# Patient Record
Sex: Female | Born: 1940 | Hispanic: Yes | Marital: Single | Smoking: Never smoker
Health system: Southern US, Community
[De-identification: ages and names within clinical notes are randomized; demographics above are authoritative.]

## PROBLEM LIST (undated history)

## (undated) DIAGNOSIS — E119 Type 2 diabetes mellitus without complications: Secondary | ICD-10-CM

## (undated) DIAGNOSIS — I10 Essential (primary) hypertension: Secondary | ICD-10-CM

## (undated) DIAGNOSIS — I2510 Atherosclerotic heart disease of native coronary artery without angina pectoris: Secondary | ICD-10-CM

---

## 2021-03-08 ENCOUNTER — Emergency Department (HOSPITAL_COMMUNITY): Payer: Self-pay

## 2021-03-08 ENCOUNTER — Inpatient Hospital Stay (HOSPITAL_COMMUNITY): Admission: EM | Disposition: E | Payer: Self-pay | Source: Home / Self Care | Attending: Cardiology

## 2021-03-08 ENCOUNTER — Encounter (HOSPITAL_COMMUNITY): Payer: Self-pay | Admitting: Emergency Medicine

## 2021-03-08 ENCOUNTER — Inpatient Hospital Stay (HOSPITAL_COMMUNITY)
Admission: EM | Admit: 2021-03-08 | Discharge: 2021-03-17 | DRG: 270 | Disposition: E | Payer: Self-pay | Attending: Cardiology | Admitting: Cardiology

## 2021-03-08 ENCOUNTER — Other Ambulatory Visit: Payer: Self-pay

## 2021-03-08 ENCOUNTER — Encounter (HOSPITAL_COMMUNITY): Payer: Self-pay

## 2021-03-08 ENCOUNTER — Emergency Department (HOSPITAL_COMMUNITY)
Admission: EM | Admit: 2021-03-08 | Discharge: 2021-03-08 | Disposition: A | Discharge status: Home / Self Care | Payer: Self-pay | Attending: Emergency Medicine | Admitting: Emergency Medicine

## 2021-03-08 DIAGNOSIS — I2109 ST elevation (STEMI) myocardial infarction involving other coronary artery of anterior wall: Principal | ICD-10-CM | POA: Diagnosis present

## 2021-03-08 DIAGNOSIS — J9601 Acute respiratory failure with hypoxia: Secondary | ICD-10-CM | POA: Diagnosis present

## 2021-03-08 DIAGNOSIS — K529 Noninfective gastroenteritis and colitis, unspecified: Secondary | ICD-10-CM | POA: Diagnosis present

## 2021-03-08 DIAGNOSIS — I701 Atherosclerosis of renal artery: Secondary | ICD-10-CM | POA: Diagnosis present

## 2021-03-08 DIAGNOSIS — I700 Atherosclerosis of aorta: Secondary | ICD-10-CM | POA: Diagnosis present

## 2021-03-08 DIAGNOSIS — R570 Cardiogenic shock: Secondary | ICD-10-CM | POA: Diagnosis present

## 2021-03-08 DIAGNOSIS — I10 Essential (primary) hypertension: Secondary | ICD-10-CM | POA: Insufficient documentation

## 2021-03-08 DIAGNOSIS — Z20822 Contact with and (suspected) exposure to covid-19: Secondary | ICD-10-CM | POA: Diagnosis present

## 2021-03-08 DIAGNOSIS — D1809 Hemangioma of other sites: Secondary | ICD-10-CM | POA: Diagnosis present

## 2021-03-08 DIAGNOSIS — Z66 Do not resuscitate: Secondary | ICD-10-CM | POA: Diagnosis not present

## 2021-03-08 DIAGNOSIS — N170 Acute kidney failure with tubular necrosis: Secondary | ICD-10-CM | POA: Diagnosis present

## 2021-03-08 DIAGNOSIS — I472 Ventricular tachycardia: Secondary | ICD-10-CM | POA: Diagnosis present

## 2021-03-08 DIAGNOSIS — R1084 Generalized abdominal pain: Secondary | ICD-10-CM

## 2021-03-08 DIAGNOSIS — I213 ST elevation (STEMI) myocardial infarction of unspecified site: Secondary | ICD-10-CM | POA: Diagnosis present

## 2021-03-08 DIAGNOSIS — Z79899 Other long term (current) drug therapy: Secondary | ICD-10-CM | POA: Insufficient documentation

## 2021-03-08 DIAGNOSIS — I2510 Atherosclerotic heart disease of native coronary artery without angina pectoris: Secondary | ICD-10-CM

## 2021-03-08 DIAGNOSIS — Z515 Encounter for palliative care: Secondary | ICD-10-CM

## 2021-03-08 DIAGNOSIS — I462 Cardiac arrest due to underlying cardiac condition: Secondary | ICD-10-CM | POA: Diagnosis present

## 2021-03-08 DIAGNOSIS — I4901 Ventricular fibrillation: Secondary | ICD-10-CM | POA: Diagnosis present

## 2021-03-08 DIAGNOSIS — Z8544 Personal history of malignant neoplasm of other female genital organs: Secondary | ICD-10-CM | POA: Insufficient documentation

## 2021-03-08 DIAGNOSIS — J9600 Acute respiratory failure, unspecified whether with hypoxia or hypercapnia: Secondary | ICD-10-CM

## 2021-03-08 DIAGNOSIS — Z9889 Other specified postprocedural states: Secondary | ICD-10-CM

## 2021-03-08 DIAGNOSIS — E119 Type 2 diabetes mellitus without complications: Secondary | ICD-10-CM | POA: Diagnosis present

## 2021-03-08 DIAGNOSIS — J9602 Acute respiratory failure with hypercapnia: Secondary | ICD-10-CM | POA: Diagnosis present

## 2021-03-08 HISTORY — PX: LEFT HEART CATH AND CORONARY ANGIOGRAPHY: CATH118249

## 2021-03-08 HISTORY — DX: Atherosclerotic heart disease of native coronary artery without angina pectoris: I25.10

## 2021-03-08 HISTORY — PX: CORONARY/GRAFT ACUTE MI REVASCULARIZATION: CATH118305

## 2021-03-08 HISTORY — DX: Essential (primary) hypertension: I10

## 2021-03-08 HISTORY — DX: Type 2 diabetes mellitus without complications: E11.9

## 2021-03-08 HISTORY — PX: IABP INSERTION: CATH118242

## 2021-03-08 HISTORY — PX: RIGHT HEART CATH: CATH118263

## 2021-03-08 LAB — URINALYSIS, ROUTINE W REFLEX MICROSCOPIC
Bacteria, UA: NONE SEEN
Bilirubin Urine: NEGATIVE
Glucose, UA: NEGATIVE mg/dL
Ketones, ur: NEGATIVE mg/dL
Nitrite: NEGATIVE
Protein, ur: 30 mg/dL — AB
Specific Gravity, Urine: 1.01 (ref 1.005–1.030)
pH: 7 (ref 5.0–8.0)

## 2021-03-08 LAB — COMPREHENSIVE METABOLIC PANEL
ALT: 41 U/L (ref 0–44)
AST: 92 U/L — ABNORMAL HIGH (ref 15–41)
Albumin: 4.4 g/dL (ref 3.5–5.0)
Alkaline Phosphatase: 67 U/L (ref 38–126)
Anion gap: 9 (ref 5–15)
BUN: 15 mg/dL (ref 8–23)
CO2: 25 mmol/L (ref 22–32)
Calcium: 10 mg/dL (ref 8.9–10.3)
Chloride: 101 mmol/L (ref 98–111)
Creatinine, Ser: 1.61 mg/dL — ABNORMAL HIGH (ref 0.44–1.00)
GFR, Estimated: 33 mL/min — ABNORMAL LOW (ref >60)
Glucose, Bld: 145 mg/dL — ABNORMAL HIGH (ref 70–99)
Potassium: 4.8 mmol/L (ref 3.5–5.1)
Sodium: 135 mmol/L (ref 135–145)
Total Bilirubin: 0.7 mg/dL (ref 0.3–1.2)
Total Protein: 7.3 g/dL (ref 6.5–8.1)

## 2021-03-08 LAB — CBC WITH DIFFERENTIAL/PLATELET
Abs Immature Granulocytes: 0.08 10*3/uL — ABNORMAL HIGH (ref 0.00–0.07)
Basophils Absolute: 0 10*3/uL (ref 0.0–0.1)
Basophils Relative: 0 %
Eosinophils Absolute: 0 10*3/uL (ref 0.0–0.5)
Eosinophils Relative: 0 %
HCT: 39.6 % (ref 36.0–46.0)
Hemoglobin: 12.9 g/dL (ref 12.0–15.0)
Immature Granulocytes: 1 %
Lymphocytes Relative: 7 %
Lymphs Abs: 0.8 10*3/uL (ref 0.7–4.0)
MCH: 29.9 pg (ref 26.0–34.0)
MCHC: 32.6 g/dL (ref 30.0–36.0)
MCV: 91.7 fL (ref 80.0–100.0)
Monocytes Absolute: 1 10*3/uL (ref 0.1–1.0)
Monocytes Relative: 8 %
Neutro Abs: 9.8 10*3/uL — ABNORMAL HIGH (ref 1.7–7.7)
Neutrophils Relative %: 84 %
Platelets: 214 10*3/uL (ref 150–400)
RBC: 4.32 MIL/uL (ref 3.87–5.11)
RDW: 15.4 % (ref 11.5–15.5)
WBC: 11.7 10*3/uL — ABNORMAL HIGH (ref 4.0–10.5)
nRBC: 0 % (ref 0.0–0.2)

## 2021-03-08 LAB — CBC
HCT: 41.4 % (ref 36.0–46.0)
Hemoglobin: 13.9 g/dL (ref 12.0–15.0)
MCH: 29.5 pg (ref 26.0–34.0)
MCHC: 33.6 g/dL (ref 30.0–36.0)
MCV: 87.9 fL (ref 80.0–100.0)
Platelets: 207 10*3/uL (ref 150–400)
RBC: 4.71 MIL/uL (ref 3.87–5.11)
RDW: 14.9 % (ref 11.5–15.5)
WBC: 5.9 10*3/uL (ref 4.0–10.5)
nRBC: 0 % (ref 0.0–0.2)

## 2021-03-08 LAB — I-STAT CHEM 8, ED
BUN: 23 mg/dL (ref 8–23)
Calcium, Ion: 1.15 mmol/L (ref 1.15–1.40)
Chloride: 99 mmol/L (ref 98–111)
Creatinine, Ser: 2 mg/dL — ABNORMAL HIGH (ref 0.44–1.00)
Glucose, Bld: 301 mg/dL — ABNORMAL HIGH (ref 70–99)
HCT: 42 % (ref 36.0–46.0)
Hemoglobin: 14.3 g/dL (ref 12.0–15.0)
Potassium: 4.2 mmol/L (ref 3.5–5.1)
Sodium: 133 mmol/L — ABNORMAL LOW (ref 135–145)
TCO2: 17 mmol/L — ABNORMAL LOW (ref 22–32)

## 2021-03-08 LAB — RESP PANEL BY RT-PCR (FLU A&B, COVID) ARPGX2
Influenza A by PCR: NEGATIVE
Influenza B by PCR: NEGATIVE
SARS Coronavirus 2 by RT PCR: NEGATIVE

## 2021-03-08 LAB — HEMOGLOBIN A1C
Hgb A1c MFr Bld: 5.9 % — ABNORMAL HIGH (ref 4.8–5.6)
Mean Plasma Glucose: 122.63 mg/dL

## 2021-03-08 LAB — TROPONIN I (HIGH SENSITIVITY): Troponin I (High Sensitivity): 117 ng/L (ref <18)

## 2021-03-08 LAB — LIPASE, BLOOD: Lipase: 32 U/L (ref 11–51)

## 2021-03-08 SURGERY — CORONARY/GRAFT ACUTE MI REVASCULARIZATION
Anesthesia: LOCAL

## 2021-03-08 MED ORDER — SODIUM BICARBONATE 8.4 % IV SOLN
INTRAVENOUS | Status: DC | PRN
  Administered 2021-03-08 (×3): 50 meq via INTRAVENOUS

## 2021-03-08 MED ORDER — SODIUM BICARBONATE 8.4 % IV SOLN
INTRAVENOUS | Status: AC
  Filled 2021-03-08: qty 100

## 2021-03-08 MED ORDER — FENTANYL CITRATE (PF) 100 MCG/2ML IJ SOLN
50.0000 ug | Freq: Once | INTRAMUSCULAR | Status: AC
  Administered 2021-03-08: 50 ug via INTRAVENOUS
  Filled 2021-03-08: qty 2

## 2021-03-08 MED ORDER — METRONIDAZOLE 500 MG PO TABS: 500.0000 mg | ORAL_TABLET | Freq: Two times a day (BID) | ORAL | 0 refills | Status: AC

## 2021-03-08 MED ORDER — HEPARIN SODIUM (PORCINE) 5000 UNIT/ML IJ SOLN: 4000.0000 [IU] | Freq: Once | INTRAMUSCULAR | Status: DC

## 2021-03-08 MED ORDER — SODIUM CHLORIDE 0.9 % IV BOLUS
2000.0000 mL | Freq: Once | INTRAVENOUS | Status: AC
  Administered 2021-03-08: 2000 mL via INTRAVENOUS

## 2021-03-08 MED ORDER — CIPROFLOXACIN HCL 500 MG PO TABS: 500.0000 mg | ORAL_TABLET | Freq: Two times a day (BID) | ORAL | 0 refills | Status: AC

## 2021-03-08 MED ORDER — SUCCINYLCHOLINE CHLORIDE 20 MG/ML IJ SOLN
100.0000 mg | Freq: Once | INTRAMUSCULAR | Status: AC
  Administered 2021-03-08: 100 mg via INTRAVENOUS

## 2021-03-08 MED ORDER — METRONIDAZOLE 500 MG PO TABS
500.0000 mg | ORAL_TABLET | Freq: Once | ORAL | Status: AC
  Administered 2021-03-08: 500 mg via ORAL
  Filled 2021-03-08: qty 1

## 2021-03-08 MED ORDER — EPINEPHRINE 1 MG/10ML IJ SOSY
PREFILLED_SYRINGE | INTRAMUSCULAR | Status: DC | PRN
  Administered 2021-03-08: 0.5 mg via INTRAVENOUS

## 2021-03-08 MED ORDER — ASPIRIN 81 MG PO CHEW
324.0000 mg | CHEWABLE_TABLET | Freq: Once | ORAL | Status: AC
  Administered 2021-03-08: 324 mg via ORAL

## 2021-03-08 MED ORDER — PROPOFOL 1000 MG/100ML IV EMUL
INTRAVENOUS | Status: AC
  Filled 2021-03-08: qty 100

## 2021-03-08 MED ORDER — LIDOCAINE HCL (CARDIAC) PF 100 MG/5ML IV SOSY
PREFILLED_SYRINGE | INTRAVENOUS | Status: DC | PRN
  Administered 2021-03-08: 100 mg via INTRAVENOUS

## 2021-03-08 MED ORDER — AMIODARONE HCL 150 MG/3ML IV SOLN
INTRAVENOUS | Status: AC
  Filled 2021-03-08: qty 3

## 2021-03-08 MED ORDER — NOREPINEPHRINE 4 MG/250ML-% IV SOLN
INTRAVENOUS | Status: AC
  Administered 2021-03-08: 10 ug/kg/min
  Administered 2021-03-09: 55 ug/min
  Filled 2021-03-08: qty 250

## 2021-03-08 MED ORDER — LACTATED RINGERS IV BOLUS
500.0000 mL | Freq: Once | INTRAVENOUS | Status: AC
  Administered 2021-03-08: 500 mL via INTRAVENOUS

## 2021-03-08 MED ORDER — HEPARIN (PORCINE) IN NACL 1000-0.9 UT/500ML-% IV SOLN
INTRAVENOUS | Status: DC | PRN
  Administered 2021-03-08 – 2021-03-09 (×5): 500 mL

## 2021-03-08 MED ORDER — SODIUM BICARBONATE 8.4 % IV SOLN
INTRAVENOUS | Status: AC
  Filled 2021-03-08: qty 50

## 2021-03-08 MED ORDER — CIPROFLOXACIN HCL 500 MG PO TABS
500.0000 mg | ORAL_TABLET | Freq: Once | ORAL | Status: AC
  Administered 2021-03-08: 500 mg via ORAL
  Filled 2021-03-08: qty 1

## 2021-03-08 MED ORDER — ONDANSETRON HCL 4 MG/2ML IJ SOLN
4.0000 mg | Freq: Once | INTRAMUSCULAR | Status: AC
  Administered 2021-03-08: 4 mg via INTRAVENOUS

## 2021-03-08 MED ORDER — FENTANYL CITRATE (PF) 100 MCG/2ML IJ SOLN
INTRAMUSCULAR | Status: DC | PRN
  Administered 2021-03-08 (×2): 12.5 ug via INTRAVENOUS
  Administered 2021-03-08 – 2021-03-09 (×2): 25 ug via INTRAVENOUS

## 2021-03-08 MED ORDER — MIDAZOLAM HCL 2 MG/2ML IJ SOLN
INTRAMUSCULAR | Status: AC
  Filled 2021-03-08: qty 2

## 2021-03-08 MED ORDER — IOHEXOL 300 MG/ML  SOLN
75.0000 mL | Freq: Once | INTRAMUSCULAR | Status: AC | PRN
  Administered 2021-03-08: 75 mL via INTRAVENOUS

## 2021-03-08 MED ORDER — FENTANYL CITRATE (PF) 100 MCG/2ML IJ SOLN
INTRAMUSCULAR | Status: AC
  Filled 2021-03-08: qty 2

## 2021-03-08 MED ORDER — MIDAZOLAM HCL 2 MG/2ML IJ SOLN
INTRAMUSCULAR | Status: DC | PRN
  Administered 2021-03-08: 1 mg via INTRAVENOUS
  Administered 2021-03-08 (×2): 0.5 mg via INTRAVENOUS
  Administered 2021-03-09: 1 mg via INTRAVENOUS

## 2021-03-08 MED ORDER — ETOMIDATE 2 MG/ML IV SOLN
20.0000 mg | Freq: Once | INTRAVENOUS | Status: AC
  Administered 2021-03-08: 20 mg via INTRAVENOUS

## 2021-03-08 MED ORDER — DOPAMINE-DEXTROSE 3.2-5 MG/ML-% IV SOLN
5.0000 ug/kg/min | INTRAVENOUS | Status: DC
  Administered 2021-03-08: 5 ug/kg/min via INTRAVENOUS

## 2021-03-08 MED ORDER — EPINEPHRINE HCL 5 MG/250ML IV SOLN IN NS
0.5000 ug/min | INTRAVENOUS | Status: DC
  Administered 2021-03-08: 10 ug/min via INTRAVENOUS
  Administered 2021-03-09 (×2): 30 ug/min via INTRAVENOUS
  Filled 2021-03-08 (×2): qty 250

## 2021-03-08 MED ORDER — HEPARIN SODIUM (PORCINE) 5000 UNIT/ML IJ SOLN
4000.0000 [IU] | Freq: Once | INTRAMUSCULAR | Status: AC
  Administered 2021-03-08: 4000 [IU] via INTRAVENOUS

## 2021-03-08 SURGICAL SUPPLY — 21 items
BALLN IABP SENSA PLUS 7.5F 40C (BALLOONS) ×2
BALLOON IABP SENS PLUS 7.5F40C (BALLOONS) ×1 IMPLANT
CATH INFINITI 5FR MULTPACK ANG (CATHETERS) ×2 IMPLANT
CATH LAUNCHER 6FR EBU3.5 (CATHETERS) ×2 IMPLANT
CATH OPTICROSS HD (CATHETERS) ×2 IMPLANT
CATH SWAN GANZ VIP 7.5F (CATHETERS) ×2 IMPLANT
KIT ENCORE 26 ADVANTAGE (KITS) ×2 IMPLANT
KIT HEART LEFT (KITS) ×2 IMPLANT
KIT MICROPUNCTURE NIT STIFF (SHEATH) ×2 IMPLANT
PACK CARDIAC CATHETERIZATION (CUSTOM PROCEDURE TRAY) ×2 IMPLANT
SHEATH PINNACLE 6F 10CM (SHEATH) ×2 IMPLANT
SHEATH PINNACLE 8F 10CM (SHEATH) ×2 IMPLANT
SLED PULL BACK IVUS (MISCELLANEOUS) ×2 IMPLANT
SLEEVE REPOSITIONING LENGTH 30 (MISCELLANEOUS) ×2 IMPLANT
TRANSDUCER W/STOPCOCK (MISCELLANEOUS) ×2 IMPLANT
TUBING CIL FLEX 10 FLL-RA (TUBING) ×2 IMPLANT
WIRE ASAHI PROWATER 180CM (WIRE) ×2 IMPLANT
WIRE COUGAR XT STRL 190CM (WIRE) ×2 IMPLANT
WIRE EMERALD 3MM-J .025X260CM (WIRE) ×2 IMPLANT
WIRE EMERALD 3MM-J .035X150CM (WIRE) ×2 IMPLANT
WIRE HI TORQ WHISPER MS 190CM (WIRE) ×2 IMPLANT

## 2021-03-08 NOTE — ED Notes (Addendum)
Shocked( 200J) x1 for VFib. Pulse present .

## 2021-03-08 NOTE — ED Triage Notes (Signed)
Patient reports low back pain / generalized abdominal pain with brief syncopal episode at home this evening , hypotensive at triage , Code Stemi activated by EDP .

## 2021-03-08 NOTE — ED Notes (Signed)
CardiologistEDP explained procedure and plan of care to the patient and son .

## 2021-03-08 NOTE — ED Provider Notes (Signed)
Pt is here visiting her daughter.  She lives in [place] and plans to return in May.  She was signed out by Dr. Johnney Killian pending CT results:   IMPRESSION:  1. Segmental wall thickening and inflammatory changes of the  rectosigmoid colon, consistent with inflammatory or infectious  colitis.  2. 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  3. Diffuse bladder wall thickening, nonspecific given decompressed  state.  4. Indeterminate hypodensity inferior aspect of the spleen, which  could reflect focal scarring or small hemangioma.  5. Extensive calcification of the mitral annulus, with diffuse  coronary artery atherosclerosis.  6. Aortic Atherosclerosis (ICD10-I70.0).   Due to language barrier, an interpreter was present during the history-taking and subsequent discussion (and for part of the physical exam) with this patient.  The atherosclerosis changes are chronic.  Pt is followed by a cardiologist in [place].  I told her that she needs to f/u with cards when she gets back and likely needs vascular as well.  I printed out the CT scan so she can send it into her doctor.  She and her daughter understand.    Pt is stable for d/c.  Return if worse.   Isla Pence, MD 03/15/2021 [phone]

## 2021-03-08 NOTE — H&P (Addendum)
Modesto Charon La Laretha Luepke is an 80 y.o. female.   Chief Complaint: Chest pain HPI:   80 year old Spanish-speaking female visiting from [place], known baseline questionable coronary artery disease, hypertension, type 2 diabetes mellitus, now with STEMI and cardiogenic shock.  Patient initially presented to [facility] emergency room with abdominal pain earlier on 02/20/2021.  Work-up at that time was suggestive of colitis seen on CT abdomen and pelvis.  She also had a hemangioma seen in the spleen, along with extensive aortic atherosclerosis including high-grade renal artery stenosis.  Patient was discharged home.  However, she returned to the emergency room few hours later complaining of right-sided as well as left-sided chest pain.  At this time, she was found to have anterior STEMI.  Blood pressure was 60/30 mmHg, likely cardiogenic shock.  I had extensive discussion with the patient and her son, using an interpreter at bedside.  Given worsening mental status, patient was unable to comprehend the discussion.  She was subsequently intubated as endotracheal tube was displaced with the first attempt.  Patient was initially on norepinephrine drip.  Dopamine was added.  Patient immediately went into ventricular tachycardia, requiring 1 shock that restored sinus rhythm.  Epinephrine was then added.   Overall, patient is in cardiogenic shock and has very high risk of mortality, as well as complications such as bleeding, contrast-induced nephropathy, limb ischemia, arrhythmia, death.  Also discussed the case with Dr. Pierre Bali.  Given her extensive atherosclerosis, she has high risk of limb ischemia and may not be able to tolerate Impella or any higher mechanical support.  I may start with balloon pump support, followed by revascularization.    Past Medical History:  Diagnosis Date  . Coronary artery disease   . Diabetes mellitus without complication ([place])   . Hypertension     History reviewed.  No pertinent surgical history.  No family history on file.  Social History:  reports that she has never smoked. She has never used smokeless tobacco. She reports that she does not drink alcohol and does not use drugs.  Allergies: No Known Allergies  Review of Systems  Unable to perform ROS: acuity of condition     Blood pressure (!) 69/32, pulse (!) 112, resp. rate (!) 32, height 5\' 3"  (1.6 m), weight 75 kg, SpO2 (!) 63 %. Body mass index is 29.29 kg/m.  Physical Exam Vitals and nursing note reviewed.  Constitutional:      General: She is not in acute distress.    Appearance: She is well-developed.  HENT:     Head: Normocephalic and atraumatic.  Eyes:     Conjunctiva/sclera: Conjunctivae normal.     Pupils: Pupils are equal, round, and reactive to light.  Neck:     Vascular: No JVD.  Cardiovascular:     Rate and Rhythm: Normal rate and regular rhythm.     Pulses: Intact distal pulses. Decreased pulses.          Femoral pulses are 1+ on the right side and 1+ on the left side.      Popliteal pulses are 0 on the right side and 0 on the left side.       Dorsalis pedis pulses are 0 on the right side.     Heart sounds: No murmur heard.     Comments: Cold extremities Pulmonary:     Effort: Pulmonary effort is normal.     Breath sounds: Normal breath sounds. No wheezing or rales.  Abdominal:  General: Bowel sounds are normal.     Palpations: Abdomen is soft.     Tenderness: There is no rebound.  Musculoskeletal:        General: No tenderness. Normal range of motion.     Left lower leg: No edema.  Lymphadenopathy:     Cervical: No cervical adenopathy.  Skin:    General: Skin is warm and dry.  Neurological:     Mental Status: She is alert.     Cranial Nerves: No cranial nerve deficit.     Comments: Not oriented. Now intubated.      Results for orders placed or performed during the hospital encounter of 02/16/2021 (from the past 48 hour(s))  CBC with Differential      Status: Abnormal   Collection Time: 02/28/2021 10:15 PM  Result Value Ref Range   WBC 11.7 (H) 4.0 - 10.5 K/uL   RBC 4.32 3.87 - 5.11 MIL/uL   Hemoglobin 12.9 12.0 - 15.0 g/dL   HCT 39.6 36.0 - 46.0 %   MCV 91.7 80.0 - 100.0 fL   MCH 29.9 26.0 - 34.0 pg   MCHC 32.6 30.0 - 36.0 g/dL   RDW 15.4 11.5 - 15.5 %   Platelets 214 150 - 400 K/uL   nRBC 0.0 0.0 - 0.2 %   Neutrophils Relative % 84 %   Neutro Abs 9.8 (H) 1.7 - 7.7 K/uL   Lymphocytes Relative 7 %   Lymphs Abs 0.8 0.7 - 4.0 K/uL   Monocytes Relative 8 %   Monocytes Absolute 1.0 0.1 - 1.0 K/uL   Eosinophils Relative 0 %   Eosinophils Absolute 0.0 0.0 - 0.5 K/uL   Basophils Relative 0 %   Basophils Absolute 0.0 0.0 - 0.1 K/uL   Immature Granulocytes 1 %   Abs Immature Granulocytes 0.08 (H) 0.00 - 0.07 K/uL    Comment: Performed at [facility], [address], [place], [place] [address]  I-stat chem 8, ED (not at [facility] or [facility])     Status: Abnormal   Collection Time: 02/16/2021 10:25 PM  Result Value Ref Range   Sodium 133 (L) 135 - 145 mmol/L   Potassium 4.2 3.5 - 5.1 mmol/L   Chloride 99 98 - 111 mmol/L   BUN 23 8 - 23 mg/dL   Creatinine, Ser 2.00 (H) 0.44 - 1.00 mg/dL   Glucose, Bld 301 (H) 70 - 99 mg/dL    Comment: Glucose reference range applies only to samples taken after fasting for at least 8 hours.   Calcium, Ion 1.15 1.15 - 1.40 mmol/L   TCO2 17 (L) 22 - 32 mmol/L   Hemoglobin 14.3 12.0 - 15.0 g/dL   HCT 42.0 36.0 - 46.0 %    Labs:   Lab Results  Component Value Date   WBC 11.7 (H) 03/11/2021   HGB 14.3 03/16/2021   HCT 42.0 03/07/2021   MCV 91.7 02/20/2021   PLT 214 02/26/2021    Recent Labs  Lab 02/21/2021 0900 02/20/2021 2225  NA 135 133*  K 4.8 4.2  CL 101 99  CO2 25  --   BUN 15 23  CREATININE 1.61* 2.00*  CALCIUM 10.0  --   PROT 7.3  --   BILITOT 0.7  --   ALKPHOS 67  --   ALT 41  --   AST 92*  --   GLUCOSE 145* 301*    Lipid Panel  No results found for: CHOL, TRIG, HDL, CHOLHDL,  VLDL,  [place]  BNP (last 3 results) No results for input(s): BNP in the last 8760 hours.  HEMOGLOBIN A1C No results found for: HGBA1C, MPG  Cardiac Panel (last 3 results) No results for input(s): CKTOTAL, CKMB, RELINDX in the last 8760 hours.  Invalid input(s): TROPONINHS  No results found for: CKTOTAL, CKMB, CKMBINDEX   TSH No results for input(s): TSH in the last 8760 hours.   (Not in a hospital admission)     Current Facility-Administered Medications:  .  DOPamine (INTROPIN) 800 mg in dextrose 5 % 250 mL (3.2 mg/mL) infusion, 5 mcg/kg/min, Intravenous, Continuous, Isla Pence, MD, Last Rate: 3.52 mL/hr at 02/21/2021 2249, 2.5 mcg/kg/min at 02/27/2021 2249 .  EPINEPHrine (ADRENALIN) 4 mg in NS 250 mL (0.016 mg/mL) premix infusion, 0.5-20 mcg/min, Intravenous, Titrated, Isla Pence, MD, Last Rate: 37.5 mL/hr at 03/16/2021 2253, 10 mcg/min at 03/10/2021 2253 .  heparin injection 4,000 Units, 4,000 Units, Intravenous, Once, Isla Pence, MD  Current Outpatient Medications:  .  ciprofloxacin (CIPRO) 500 MG tablet, Take 1 tablet (500 mg total) by mouth every 12 (twelve) hours., Disp: 14 tablet, Rfl: 0 .  metroNIDAZOLE (FLAGYL) 500 MG tablet, Take 1 tablet (500 mg total) by mouth 2 (two) times daily., Disp: 14 tablet, Rfl: 0   Today's Vitals   02/21/2021 2206 02/21/2021 2215 03/03/2021 2230 03/04/2021 2245  BP:  (!) 75/58 (!) 69/32   Pulse:  (!) 53 85 (!) 112  Resp:  (!) 26 17 (!) 32  SpO2:  99% (!) 56% (!) 63%  Weight: 75 kg     Height: 5\' 3"  (1.6 m)     PainSc: 10-Worst pain ever      Body mass index is 29.29 kg/m.    CARDIAC STUDIES:  EKG 02/19/2021: Anterior STEMI:    Assessment/Plan  STEMI Cardiogenic shock  High risk of mortality Revascularization with tolerated mechanical support  CRITICAL CARE Performed by: Vernell Leep   Total critical care time: 55 minutes   Critical care time was exclusive of separately billable procedures and treating other  patients.   Critical care was necessary to treat or prevent imminent or life-threatening deterioration.   Critical care was time spent personally by me on the following activities: development of treatment plan with patient and/or surrogate as well as nursing, discussions with consultants, evaluation of patient's response to treatment, examination of patient, obtaining history from patient or surrogate, ordering and performing treatments and interventions, ordering and review of laboratory studies, ordering and review of radiographic studies, pulse oximetry and re-evaluation of patient's condition.     Nigel Mormon, MD Pager: [phone] Office: [phone]

## 2021-03-08 NOTE — ED Provider Notes (Signed)
[facility] EMERGENCY DEPARTMENT Provider Note   CSN: [id number] Arrival date & time: 02/18/2021  2142     History Chief Complaint  Patient presents with  . Back Pain  Syncope    Modesto Charon La Clayton Jarmon is a 80 y.o. female.  Pt presents to the ED today with cp and syncope.  Pt was seen by me earlier today for abdominal pain.  She was diagnosed with colitis and d/c with flagyl and cipro.  The pt also had significant vascular disease.  She has a hx of this and sees a cardiologist in [place].  She was ok when she left then went home and had a syncopal event at home.  She came in by private auto and was hypotensive in triage.  EKG was done which showed a STEMI.  Code STEMI called.        Past Medical History:  Diagnosis Date  . Coronary artery disease   . Diabetes mellitus without complication ([place])   . Hypertension     There are no problems to display for this patient.   History reviewed. No pertinent surgical history.   OB History   No obstetric history on file.     No family history on file.  Social History   Tobacco Use  . Smoking status: Never Smoker  . Smokeless tobacco: Never Used  Substance Use Topics  . Alcohol use: Never  . Drug use: Never    Home Medications Prior to Admission medications   Medication Sig Start Date End Date Taking? Authorizing Provider  ciprofloxacin (CIPRO) 500 MG tablet Take 1 tablet (500 mg total) by mouth every 12 (twelve) hours. 03/01/2021   Isla Pence, MD  metroNIDAZOLE (FLAGYL) 500 MG tablet Take 1 tablet (500 mg total) by mouth 2 (two) times daily. 02/24/2021   Isla Pence, MD    Allergies    Patient has no known allergies.  Review of Systems   Review of Systems  Cardiovascular: Positive for chest pain.  All other systems reviewed and are negative.   Physical Exam Updated Vital Signs BP (!) 69/32   Pulse (!) 112   Resp (!) 32   Ht 5\' 3"  (1.6 m)   Wt 75 kg   SpO2 (!) 63%   BMI 29.29 kg/m    Physical Exam Vitals and nursing note reviewed.  Constitutional:      General: She is in acute distress.     Appearance: She is ill-appearing.  HENT:     Head: Normocephalic and atraumatic.     Right Ear: External ear normal.     Left Ear: External ear normal.     Nose: Nose normal.     Mouth/Throat:     Mouth: Mucous membranes are dry.  Eyes:     Extraocular Movements: Extraocular movements intact.     Pupils: Pupils are equal, round, and reactive to light.  Cardiovascular:     Rate and Rhythm: Normal rate and regular rhythm.     Pulses: Normal pulses.     Heart sounds: Normal heart sounds.  Pulmonary:     Effort: Pulmonary effort is normal.     Breath sounds: Normal breath sounds.  Abdominal:     General: Abdomen is flat. Bowel sounds are normal.     Palpations: Abdomen is soft.  Musculoskeletal:        General: Normal range of motion.     Cervical back: Normal range of motion and neck supple.  Skin:  General: Skin is warm.     Capillary Refill: Capillary refill takes less than 2 seconds.  Neurological:     General: No focal deficit present.     Mental Status: She is alert and oriented to person, place, and time.  Psychiatric:        Mood and Affect: Mood normal.        Behavior: Behavior normal.        Thought Content: Thought content normal.        Judgment: Judgment normal.     ED Results / Procedures / Treatments   Labs (all labs ordered are listed, but only abnormal results are displayed) Labs Reviewed  CBC WITH DIFFERENTIAL/PLATELET - Abnormal; Notable for the following components:      Result Value   WBC 11.7 (*)    Neutro Abs 9.8 (*)    Abs Immature Granulocytes 0.08 (*)    All other components within normal limits  HEMOGLOBIN A1C - Abnormal; Notable for the following components:   Hgb A1c MFr Bld 5.9 (*)    All other components within normal limits  I-STAT CHEM 8, ED - Abnormal; Notable for the following components:   Sodium 133 (*)     Creatinine, Ser 2.00 (*)    Glucose, Bld 301 (*)    TCO2 17 (*)    All other components within normal limits  RESP PANEL BY RT-PCR (FLU A&B, COVID) ARPGX2  COMPREHENSIVE METABOLIC PANEL  LIPID PANEL  PROTIME-INR  APTT  TROPONIN I (HIGH SENSITIVITY)    EKG None  Radiology CT Abdomen Pelvis W Contrast  Result Date: 03/14/2021 CLINICAL DATA:  Generalized abdominal pain, history of gynecological cancer status post hysterectomy EXAM: CT ABDOMEN AND PELVIS WITH CONTRAST TECHNIQUE: Multidetector CT imaging of the abdomen and pelvis was performed using the standard protocol following bolus administration of intravenous contrast. CONTRAST:  27mL OMNIPAQUE IOHEXOL 300 MG/ML  SOLN COMPARISON:  None. FINDINGS: Lower chest: No acute pleural or parenchymal lung disease. Dense calcification of the mitral annulus. Moderate coronary artery atherosclerosis. Hepatobiliary: No focal liver abnormality is seen. No gallstones, gallbladder wall thickening, or biliary dilatation. Pancreas: Unremarkable. No pancreatic ductal dilatation or surrounding inflammatory changes. Spleen: Indeterminate 1.3 cm hypodensity inferior aspect of the spleen, which may be sequela of previous infarct or small hemangioma. The remainder of the spleen is unremarkable, with no evidence of splenomegaly. Adrenals/Urinary Tract: The left kidney is markedly atrophic, with minimal cortical enhancement. There is high-grade stenosis of the left renal artery, with evidence of mural thrombus at the left renal artery ostia within the aortic lumen. Findings likely reflect a sequela of chronic longstanding renal artery stenosis. Minimal function of the left kidney, with no significant contrast excretion on delayed imaging. Areas of scarring within the right renal cortex, primarily within the interpolar region, may also be sequela from chronic renal artery atherosclerosis. No evidence of obstructive uropathy within the right kidney. The adrenals are grossly  normal. Bladder is decompressed, with nonspecific diffuse bladder wall thickening. No perivesicular fat stranding. Stomach/Bowel: There is segmental wall thickening of the rectosigmoid colon, with pericolonic fat stranding, consistent with inflammatory or infectious colitis. No bowel obstruction or ileus. Normal appendix right lower quadrant. Vascular/Lymphatic: There is extensive atherosclerosis throughout the aorta and its branches. As discussed above, there is high-grade stenosis of the left renal artery, with atheromatous plaque at the left renal artery ostia extending into the aortic lumen, best seen image 32/3. No pathologic adenopathy within the abdomen or pelvis. Reproductive: Status  post hysterectomy. No adnexal masses. Other: No free fluid or free gas.  No abdominal wall hernia. Musculoskeletal: No acute or destructive bony lesions. Reconstructed images demonstrate no additional findings. IMPRESSION: 1. Segmental wall thickening and inflammatory changes of the rectosigmoid colon, consistent with inflammatory or infectious colitis. 2. Abnormal appearance of the bilateral kidneys, left greater than right, likely sequela of chronic renal artery atherosclerosis. 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 3. Diffuse bladder wall thickening, nonspecific given decompressed state. 4. Indeterminate hypodensity inferior aspect of the spleen, which could reflect focal scarring or small hemangioma. 5. Extensive calcification of the mitral annulus, with diffuse coronary artery atherosclerosis. 6.  Aortic Atherosclerosis (ICD10-I70.0). Electronically Signed   By: Randa Ngo M.D.   On: 02/20/2021 15:57   DG Chest Port 1 View  Result Date: 03/07/2021 CLINICAL DATA:  Chest pain, post intubation EXAM: PORTABLE  CHEST 1 VIEW COMPARISON:  None FINDINGS: Endotracheal tube is 2.7 cm above the carina. NG tube tip is in the stomach. Cardiomegaly, vascular congestion. Bilateral airspace opacities in the left perihilar region/lingula and right upper lobe concerning for pneumonia. No effusions. No acute bony abnormality. IMPRESSION: Support devices as above. Cardiomegaly, vascular congestion. Patchy bilateral airspace disease in the left perihilar region/lingula and right upper lobe concerning for pneumonia. Electronically Signed   By: Rolm Baptise M.D.   On: 02/15/2021 23:08    Procedures Procedure Name: Intubation Date/Time: 02/17/2021 11:12 PM Performed by: Isla Pence, MD Pre-anesthesia Checklist: Patient identified, Patient being monitored, Emergency Drugs available, Timeout performed and Suction available Oxygen Delivery Method: Non-rebreather mask Preoxygenation: Pre-oxygenation with 100% oxygen Induction Type: Rapid sequence Ventilation: Mask ventilation without difficulty Laryngoscope Size: Glidescope and 3 Tube size: 7.5 mm Number of attempts: 1 Placement Confirmation: ETT inserted through vocal cords under direct vision,  CO2 detector and Breath sounds checked- equal and bilateral Comments: Right after intubation, pt went into vfib prior to the tube getting secured.  Pt shocked at 200J and I think the tube dislodged.  The tube was initially visualized going into the airway and there were good breath sounds and color change.  Pt's O2 sats were not picking up, so I looked again and the tube no longer looks like it is in the airway, so I re-intubated.    Procedure Name: Intubation Date/Time: 03/15/2021 11:14 PM Performed by: Isla Pence, MD Pre-anesthesia Checklist: Patient identified, Patient being monitored, Emergency Drugs available, Timeout performed and Suction available Oxygen Delivery Method: Non-rebreather mask Preoxygenation: Pre-oxygenation with 100% oxygen Induction Type: Rapid  sequence Ventilation: Mask ventilation without difficulty Laryngoscope Size: Glidescope and 3 Tube size: 7.5 mm Number of attempts: 2 Placement Confirmation: ETT inserted through vocal cords under direct vision,  CO2 detector and Breath sounds checked- equal and bilateral Secured at: 23 cm Tube secured with: ETT holder Dental Injury: Teeth and Oropharynx as per pre-operative assessment  Comments: In the 2nd attempt at intubation, the tube was visualized going through the cords and good color change.  Tube was securely fastened.         Medications Ordered in ED Medications  heparin injection 4,000 Units ( Intravenous MAR Hold 02/27/2021 2315)  DOPamine (INTROPIN) 800 mg in dextrose 5 % 250 mL (3.2 mg/mL) infusion (2.5 mcg/kg/min  75 kg Intravenous Rate/Dose Change 03/04/2021 2249)  EPINEPHrine (ADRENALIN) 4 mg in NS  250 mL (0.016 mg/mL) premix infusion ( Intravenous MAR Hold 03/13/2021 2315)  norepinephrine (LEVOPHED) 4-5 MG/250ML-% infusion SOLN (15 mcg/kg/min  Rate/Dose Change 02/15/2021 2231)  sodium chloride 0.9 % bolus 2,000 mL (2,000 mLs Intravenous New Bag/Given 02/25/2021 2212)  aspirin chewable tablet 324 mg (324 mg Oral Given 02/14/2021 2218)  heparin injection 4,000 Units (4,000 Units Intravenous Given 02/27/2021 2218)  ondansetron (ZOFRAN) injection 4 mg (4 mg Intravenous Given 03/13/2021 2221)  etomidate (AMIDATE) injection 20 mg (20 mg Intravenous Given 02/14/2021 2244)  succinylcholine (ANECTINE) injection 100 mg (100 mg Intravenous Given 02/19/2021 2244)    ED Course  I have reviewed the triage vital signs and the nursing notes.  Pertinent labs & imaging results that were available during my care of the patient were reviewed by me and considered in my medical decision making (see chart for details).    MDM Rules/Calculators/A&P                           Code stemi called.  Pt d/w Dr. Virgina Jock and with cards fellow.  Pt is in cardiogenic shock.  She is given IVFs and started on  levophed.  Pt's son is with her and gives consent for cath and likely balloon pump.  Pt's ms is worsening as she has been here.  Decision made to intubate prior to taking her to the cath lab.   CRITICAL CARE Performed by: Isla Pence   Total critical care time: 60 minutes  Critical care time was exclusive of separately billable procedures and treating other patients.  Critical care was necessary to treat or prevent imminent or life-threatening deterioration.  Critical care was time spent personally by me on the following activities: development of treatment plan with patient and/or surrogate as well as nursing, discussions with consultants, evaluation of patient's response to treatment, examination of patient, obtaining history from patient or surrogate, ordering and performing treatments and interventions, ordering and review of laboratory studies, ordering and review of radiographic studies, pulse oximetry and re-evaluation of patient's condition.  Final Clinical Impression(s) / ED Diagnoses Final diagnoses:  ST elevation myocardial infarction (STEMI), unspecified artery (HCC)  Cardiogenic shock (HCC)  Ventricular fibrillation ([place])  Acute respiratory failure, unspecified whether with hypoxia or hypercapnia [facility])    Rx / DC Orders ED Discharge Orders    None       Isla Pence, MD 02/27/2021 2316

## 2021-03-08 NOTE — ED Notes (Signed)
Waiting call from cath lab , cardiologist and EDP at bedside .

## 2021-03-08 NOTE — ED Notes (Signed)
EDP will intubate patient before going to cath lab .

## 2021-03-08 NOTE — ED Notes (Signed)
Got PATIENT INTO A GOWN ON THE MONITOR PATIENT IS RESTING WITH FAMILY AT BEDSIDE AND CALL BELL IN REACH GOT PATIENT A WARM BLANKET

## 2021-03-08 NOTE — ED Provider Notes (Signed)
[facility] EMERGENCY DEPARTMENT Provider Note   CSN: [id number] Arrival date & time: 02/21/2021  9675     History No chief complaint on file.   Marie Barrett is a 80 y.o. female.  HPI Patient arrived from [place] 10 days ago.  She is with her daughter.  She has medical history significant for gynecological cancer with hysterectomy 2 years ago.  This was done in [place].  Patient reports she has been well since that time.  She does not suffer from recurrent abdominal pain.  2 days ago she started getting pretty severe abdominal pain.  Is generalized.  Cramping and aching throughout the entire abdomen.  Last night she had pain quite severe and limited her activities.  Not developed any vomiting or diarrhea.  No constipation or urinary symptoms.  Patient denies history of similar pain.  He denies any associated chest pain or shortness of breath.  Patient does have history of hypertension she takes amlodipine, triamterene and an ARB.     Past Medical History:  Diagnosis Date  . Coronary artery disease     There are no problems to display for this patient.   History reviewed. No pertinent surgical history.   OB History   No obstetric history on file.     No family history on file.     Home Medications Prior to Admission medications   Not on File    Allergies    Patient has no known allergies.  Review of Systems   Review of Systems 10 systems reviewed negative except as per HPI Physical Exam Updated Vital Signs BP (!) 182/88   Pulse 71   Temp 98.3 F (36.8 C)   Resp 20   SpO2 99%   Physical Exam Constitutional:      Comments: Alert nontoxic.  Clear mental status.  No respiratory distress  HENT:     Head: Normocephalic and atraumatic.     Mouth/Throat:     Pharynx: Oropharynx is clear.  Eyes:     Conjunctiva/sclera: Conjunctivae normal.  Cardiovascular:     Rate and Rhythm: Normal rate and regular rhythm.  Pulmonary:      Effort: Pulmonary effort is normal.     Breath sounds: Normal breath sounds.  Abdominal:     Comments: Abdomen is soft.  Surgical midline scar lower abdomen well-healed.  No guarding.  Mild diffuse tenderness of the right lower quadrant and periumbilical area.  No palpable mass.  Musculoskeletal:        General: No swelling or tenderness. Normal range of motion.     Right lower leg: No edema.     Left lower leg: No edema.     Comments: No peripheral edema.  Skin condition of the lower legs and feet is very good.  Patient incidentally appears to have a fairly large, nonerythematous synovial cyst on the dorsum of the left foot  Skin:    General: Skin is warm and dry.  Neurological:     General: No focal deficit present.     Mental Status: She is oriented to person, place, and time.     Coordination: Coordination normal.  Psychiatric:        Mood and Affect: Mood normal.     ED Results / Procedures / Treatments   Labs (all labs ordered are listed, but only abnormal results are displayed) Labs Reviewed  COMPREHENSIVE METABOLIC PANEL - Abnormal; Notable for the following components:      Result  Value   Glucose, Bld 145 (*)    Creatinine, Ser 1.61 (*)    AST 92 (*)    GFR, Estimated 33 (*)    All other components within normal limits  URINALYSIS, ROUTINE W REFLEX MICROSCOPIC - Abnormal; Notable for the following components:   Hgb urine dipstick MODERATE (*)    Protein, ur 30 (*)    Leukocytes,Ua TRACE (*)    All other components within normal limits  LIPASE, BLOOD  CBC    EKG None  Radiology No results found.  Procedures Procedures   Medications Ordered in ED Medications  lactated ringers bolus 500 mL (0 mLs Intravenous Stopped 03/02/2021 1411)    ED Course  I have reviewed the triage vital signs and the nursing notes.  Pertinent labs & imaging results that were available during my care of the patient were reviewed by me and considered in my medical decision making  (see chart for details).    MDM Rules/Calculators/A&P                          Patient presented as outlined with generalized abdominal pain without guarding or palpable mass.  Patient does have lower abdominal incision from prior surgical resection, reportedly hysterectomy from GYN cancer 2 years ago.  We will proceed with CT abdomen pelvis to rule out recurrence of cancer, obstruction or other surgical etiology.  Patient is alert and nontoxic.  Mental status is clear.  She is Spanish-speaking and exhibiting no signs of confusion during examination.  Speech is clear and situationally appropriate.  No respiratory distress.  Plan for Dr. Gilford Raid to follow-up on CT scan for final disposition.  Initial impression is for likely nonsurgical abdominal pain, otherwise well in appearance at time of assessment and consideration for discharge with appropriate treatment based on CT results. Final Clinical Impression(s) / ED Diagnoses Final diagnoses:  Generalized abdominal pain   Chart completed 08: 45 3\24\2022.  EMR review shows the patient deceased yesterday evening on return visit with acute MI. This was completely unanticipated based on my evaluation of the patient earlier in the day.  At that time, history of present illness and physical exam were not suggestive of cardiac ischemic disease, although she had known history of hypertension and risk factors.  Rx / DC Orders ED Discharge Orders    None       Charlesetta Shanks, MD 03/30/21 [phone]

## 2021-03-08 NOTE — ED Triage Notes (Signed)
Patient complains of generalized abdominal pain with no associated symptoms. Denies urinary symptoms as well. NAD

## 2021-03-08 NOTE — ED Notes (Signed)
Patient transported to CT 

## 2021-03-08 NOTE — ED Notes (Signed)
Transported to cath lab with cardiologist, RT and RN.

## 2021-03-08 NOTE — ED Triage Notes (Signed)
Triage RN pulled from another room to assess patient. Pt noted to be sitting a wheelchair, unable to sit up in chair, generally weak, pale. Bp on multiple attempts in the 91B systolic. Pt does not speak English, pt taken to room

## 2021-03-08 NOTE — ED Notes (Signed)
Plan of care explained by EDP and cardiologist to patient and son at bedside .

## 2021-03-08 NOTE — Discharge Instructions (Addendum)
Take tylenol for pain.

## 2021-03-09 ENCOUNTER — Inpatient Hospital Stay (HOSPITAL_COMMUNITY): Payer: Self-pay

## 2021-03-09 ENCOUNTER — Encounter (HOSPITAL_COMMUNITY): Payer: Self-pay | Admitting: Cardiology

## 2021-03-09 DIAGNOSIS — I213 ST elevation (STEMI) myocardial infarction of unspecified site: Secondary | ICD-10-CM | POA: Diagnosis present

## 2021-03-09 DIAGNOSIS — I2101 ST elevation (STEMI) myocardial infarction involving left main coronary artery: Secondary | ICD-10-CM

## 2021-03-09 DIAGNOSIS — R570 Cardiogenic shock: Secondary | ICD-10-CM

## 2021-03-09 LAB — POCT I-STAT EG7
Acid-base deficit: 11 mmol/L — ABNORMAL HIGH (ref 0.0–2.0)
Acid-base deficit: 2 mmol/L (ref 0.0–2.0)
Bicarbonate: 19 mmol/L — ABNORMAL LOW (ref 20.0–28.0)
Bicarbonate: 26.7 mmol/L (ref 20.0–28.0)
Calcium, Ion: 0.98 mmol/L — ABNORMAL LOW (ref 1.15–1.40)
Calcium, Ion: 0.93 mmol/L — ABNORMAL LOW (ref 1.15–1.40)
HCT: 33 % — ABNORMAL LOW (ref 36.0–46.0)
HCT: 32 % — ABNORMAL LOW (ref 36.0–46.0)
Hemoglobin: 11.2 g/dL — ABNORMAL LOW (ref 12.0–15.0)
Hemoglobin: 10.9 g/dL — ABNORMAL LOW (ref 12.0–15.0)
O2 Saturation: 47 %
O2 Saturation: 45 %
Potassium: 3.8 mmol/L (ref 3.5–5.1)
Potassium: 4.2 mmol/L (ref 3.5–5.1)
Sodium: 138 mmol/L (ref 135–145)
Sodium: 141 mmol/L (ref 135–145)
TCO2: 21 mmol/L — ABNORMAL LOW (ref 22–32)
TCO2: 29 mmol/L (ref 22–32)
pCO2, Ven: 64.4 mmHg — ABNORMAL HIGH (ref 44.0–60.0)
pCO2, Ven: 65.6 mmHg — ABNORMAL HIGH (ref 44.0–60.0)
pH, Ven: 7.079 — CL (ref 7.250–7.430)
pH, Ven: 7.218 — ABNORMAL LOW (ref 7.250–7.430)
pO2, Ven: 36 mmHg (ref 32.0–45.0)
pO2, Ven: 31 mmHg — CL (ref 32.0–45.0)

## 2021-03-09 LAB — POCT I-STAT 7, (LYTES, BLD GAS, ICA,H+H)
Acid-base deficit: 1 mmol/L (ref 0.0–2.0)
Acid-base deficit: 19 mmol/L — ABNORMAL HIGH (ref 0.0–2.0)
Acid-base deficit: 15 mmol/L — ABNORMAL HIGH (ref 0.0–2.0)
Bicarbonate: 25.4 mmol/L (ref 20.0–28.0)
Bicarbonate: 9.7 mmol/L — ABNORMAL LOW (ref 20.0–28.0)
Bicarbonate: 11.9 mmol/L — ABNORMAL LOW (ref 20.0–28.0)
Calcium, Ion: 1.03 mmol/L — ABNORMAL LOW (ref 1.15–1.40)
Calcium, Ion: 0.9 mmol/L — ABNORMAL LOW (ref 1.15–1.40)
Calcium, Ion: >2.5 mmol/L (ref 1.15–1.40)
HCT: 34 % — ABNORMAL LOW (ref 36.0–46.0)
HCT: 36 % (ref 36.0–46.0)
HCT: 27 % — ABNORMAL LOW (ref 36.0–46.0)
Hemoglobin: 11.6 g/dL — ABNORMAL LOW (ref 12.0–15.0)
Hemoglobin: 12.2 g/dL (ref 12.0–15.0)
Hemoglobin: 9.2 g/dL — ABNORMAL LOW (ref 12.0–15.0)
O2 Saturation: 100 %
O2 Saturation: 99 %
O2 Saturation: 100 %
Patient temperature: 36
Potassium: 4.3 mmol/L (ref 3.5–5.1)
Potassium: 3.7 mmol/L (ref 3.5–5.1)
Potassium: 4.6 mmol/L (ref 3.5–5.1)
Sodium: 148 mmol/L — ABNORMAL HIGH (ref 135–145)
Sodium: 141 mmol/L (ref 135–145)
Sodium: 132 mmol/L — ABNORMAL LOW (ref 135–145)
TCO2: 27 mmol/L (ref 22–32)
TCO2: 11 mmol/L — ABNORMAL LOW (ref 22–32)
TCO2: 13 mmol/L — ABNORMAL LOW (ref 22–32)
pCO2 arterial: 50.4 mmHg — ABNORMAL HIGH (ref 32.0–48.0)
pCO2 arterial: 31.6 mmHg — ABNORMAL LOW (ref 32.0–48.0)
pCO2 arterial: 29.6 mmHg — ABNORMAL LOW (ref 32.0–48.0)
pH, Arterial: 7.311 — ABNORMAL LOW (ref 7.350–7.450)
pH, Arterial: 7.095 — CL (ref 7.350–7.450)
pH, Arterial: 7.205 — ABNORMAL LOW (ref 7.350–7.450)
pO2, Arterial: 275 mmHg — ABNORMAL HIGH (ref 83.0–108.0)
pO2, Arterial: 163 mmHg — ABNORMAL HIGH (ref 83.0–108.0)
pO2, Arterial: 518 mmHg — ABNORMAL HIGH (ref 83.0–108.0)

## 2021-03-09 LAB — COMPREHENSIVE METABOLIC PANEL
ALT: 52 U/L — ABNORMAL HIGH (ref 0–44)
AST: 96 U/L — ABNORMAL HIGH (ref 15–41)
Albumin: 3.7 g/dL (ref 3.5–5.0)
Alkaline Phosphatase: 66 U/L (ref 38–126)
Anion gap: 21 — ABNORMAL HIGH (ref 5–15)
BUN: 19 mg/dL (ref 8–23)
CO2: 13 mmol/L — ABNORMAL LOW (ref 22–32)
Calcium: 9 mg/dL (ref 8.9–10.3)
Chloride: 98 mmol/L (ref 98–111)
Creatinine, Ser: 2.32 mg/dL — ABNORMAL HIGH (ref 0.44–1.00)
GFR, Estimated: 21 mL/min — ABNORMAL LOW (ref >60)
Glucose, Bld: 306 mg/dL — ABNORMAL HIGH (ref 70–99)
Potassium: 4.4 mmol/L (ref 3.5–5.1)
Sodium: 132 mmol/L — ABNORMAL LOW (ref 135–145)
Total Bilirubin: 1.1 mg/dL (ref 0.3–1.2)
Total Protein: 6.5 g/dL (ref 6.5–8.1)

## 2021-03-09 LAB — LIPID PANEL
Cholesterol: 216 mg/dL — ABNORMAL HIGH (ref 0–200)
HDL: 67 mg/dL (ref >40)
LDL Cholesterol: 129 mg/dL — ABNORMAL HIGH (ref 0–99)
Total CHOL/HDL Ratio: 3.2 RATIO
Triglycerides: 98 mg/dL (ref <150)
VLDL: 20 mg/dL (ref 0–40)

## 2021-03-09 LAB — APTT: aPTT: 74 seconds — ABNORMAL HIGH (ref 24–36)

## 2021-03-09 LAB — TROPONIN I (HIGH SENSITIVITY): Troponin I (High Sensitivity): >27000 ng/L (ref <18)

## 2021-03-09 LAB — LACTIC ACID, PLASMA: Lactic Acid, Venous: >11 mmol/L (ref 0.5–1.9)

## 2021-03-09 LAB — POCT ACTIVATED CLOTTING TIME: Activated Clotting Time: 273 seconds

## 2021-03-09 LAB — PROTIME-INR
INR: 1.4 — ABNORMAL HIGH (ref 0.8–1.2)
Prothrombin Time: 16.3 seconds — ABNORMAL HIGH (ref 11.4–15.2)

## 2021-03-09 MED ORDER — MIDAZOLAM HCL 2 MG/2ML IJ SOLN: 1.0000 mg | INTRAMUSCULAR | Status: DC | PRN

## 2021-03-09 MED ORDER — SODIUM CHLORIDE 0.9 % IV SOLN: 250.0000 mL | INTRAVENOUS | Status: DC | PRN

## 2021-03-09 MED ORDER — GLYCOPYRROLATE 0.2 MG/ML IJ SOLN: 0.2000 mg | INTRAMUSCULAR | Status: DC | PRN

## 2021-03-09 MED ORDER — TIROFIBAN (AGGRASTAT) BOLUS VIA INFUSION
INTRAVENOUS | Status: DC | PRN
  Administered 2021-03-09: 1875 ug via INTRAVENOUS

## 2021-03-09 MED ORDER — INSULIN ASPART 100 UNIT/ML ~~LOC~~ SOLN: 0.0000 [IU] | Freq: Every day | SUBCUTANEOUS | Status: DC

## 2021-03-09 MED ORDER — AMIODARONE IV BOLUS ONLY 150 MG/100ML
150.0000 mg | Freq: Once | INTRAVENOUS | Status: AC
  Administered 2021-03-09: 150 mg via INTRAVENOUS
  Filled 2021-03-09: qty 100

## 2021-03-09 MED ORDER — SODIUM CHLORIDE 0.9% FLUSH: 3.0000 mL | Freq: Two times a day (BID) | INTRAVENOUS | Status: DC

## 2021-03-09 MED ORDER — EPINEPHRINE 1 MG/10ML IJ SOSY
PREFILLED_SYRINGE | INTRAMUSCULAR | Status: AC
  Filled 2021-03-09: qty 10

## 2021-03-09 MED ORDER — MIDAZOLAM HCL 2 MG/2ML IJ SOLN
INTRAMUSCULAR | Status: AC
  Filled 2021-03-09: qty 2

## 2021-03-09 MED ORDER — HYDRALAZINE HCL 20 MG/ML IJ SOLN: 10.0000 mg | INTRAMUSCULAR | Status: DC | PRN

## 2021-03-09 MED ORDER — IOHEXOL 350 MG/ML SOLN
INTRAVENOUS | Status: DC | PRN
  Administered 2021-03-09: 50 mL

## 2021-03-09 MED ORDER — INSULIN ASPART 100 UNIT/ML ~~LOC~~ SOLN: 0.0000 [IU] | Freq: Three times a day (TID) | SUBCUTANEOUS | Status: DC

## 2021-03-09 MED ORDER — MORPHINE SULFATE (PF) 2 MG/ML IV SOLN: 2.0000 mg | INTRAVENOUS | Status: DC | PRN

## 2021-03-09 MED ORDER — LABETALOL HCL 5 MG/ML IV SOLN: 10.0000 mg | INTRAVENOUS | Status: DC | PRN

## 2021-03-09 MED ORDER — MORPHINE 100MG IN NS 100ML (1MG/ML) PREMIX INFUSION: 0.0000 mg/h | INTRAVENOUS | Status: DC

## 2021-03-09 MED ORDER — FENTANYL BOLUS VIA INFUSION
25.0000 ug | INTRAVENOUS | Status: DC | PRN
Start: 2021-03-09 — End: 2021-03-09
  Filled 2021-03-09: qty 100

## 2021-03-09 MED ORDER — TIROFIBAN HCL IN NACL 5-0.9 MG/100ML-% IV SOLN
INTRAVENOUS | Status: AC
  Filled 2021-03-09: qty 100

## 2021-03-09 MED ORDER — HEPARIN SODIUM (PORCINE) 1000 UNIT/ML IJ SOLN
INTRAMUSCULAR | Status: DC | PRN
  Administered 2021-03-08: 7000 [IU] via INTRAVENOUS

## 2021-03-09 MED ORDER — GLYCOPYRROLATE 1 MG PO TABS: 1.0000 mg | ORAL_TABLET | ORAL | Status: DC | PRN

## 2021-03-09 MED ORDER — HEPARIN (PORCINE) IN NACL 1000-0.9 UT/500ML-% IV SOLN
INTRAVENOUS | Status: AC
  Filled 2021-03-09: qty 2000

## 2021-03-09 MED ORDER — LIDOCAINE HCL (CARDIAC) PF 100 MG/5ML IV SOSY
PREFILLED_SYRINGE | INTRAVENOUS | Status: AC
  Filled 2021-03-09: qty 5

## 2021-03-09 MED ORDER — LIDOCAINE HCL (PF) 1 % IJ SOLN
INTRAMUSCULAR | Status: AC
  Filled 2021-03-09: qty 30

## 2021-03-09 MED ORDER — FENTANYL 2500MCG IN NS 250ML (10MCG/ML) PREMIX INFUSION: 25.0000 ug/h | INTRAVENOUS | Status: DC

## 2021-03-09 MED ORDER — POLYVINYL ALCOHOL 1.4 % OP SOLN: 1.0000 [drp] | Freq: Four times a day (QID) | OPHTHALMIC | Status: DC | PRN

## 2021-03-09 MED ORDER — ACETAMINOPHEN 650 MG RE SUPP: 650.0000 mg | Freq: Four times a day (QID) | RECTAL | Status: DC | PRN

## 2021-03-09 MED ORDER — SODIUM CHLORIDE 0.9% FLUSH: 3.0000 mL | INTRAVENOUS | Status: DC | PRN

## 2021-03-09 MED ORDER — FENTANYL CITRATE (PF) 100 MCG/2ML IJ SOLN: 25.0000 ug | INTRAMUSCULAR | Status: DC | PRN

## 2021-03-09 MED ORDER — AMIODARONE HCL 150 MG/3ML IV SOLN
INTRAVENOUS | Status: AC
  Filled 2021-03-09: qty 3

## 2021-03-09 MED ORDER — POLYETHYLENE GLYCOL 3350 17 G PO PACK: 17.0000 g | PACK | Freq: Every day | ORAL | Status: DC

## 2021-03-09 MED ORDER — NOREPINEPHRINE 16 MG/250ML-% IV SOLN
0.0000 ug/min | INTRAVENOUS | Status: DC
  Administered 2021-03-09: 55 ug/min via INTRAVENOUS
  Filled 2021-03-09: qty 250

## 2021-03-09 MED ORDER — MORPHINE BOLUS VIA INFUSION
5.0000 mg | INTRAVENOUS | Status: DC | PRN
  Filled 2021-03-09: qty 5

## 2021-03-09 MED ORDER — DEXTROSE 50 % IV SOLN: 12.5000 g | INTRAVENOUS | Status: DC

## 2021-03-09 MED ORDER — CLOPIDOGREL BISULFATE 75 MG PO TABS: 75.0000 mg | ORAL_TABLET | Freq: Every day | ORAL | Status: DC

## 2021-03-09 MED ORDER — MIDAZOLAM 50MG/50ML (1MG/ML) PREMIX INFUSION: 0.0000 mg/h | INTRAVENOUS | Status: DC

## 2021-03-09 MED ORDER — LIDOCAINE HCL (PF) 1 % IJ SOLN
INTRAMUSCULAR | Status: DC | PRN
  Administered 2021-03-08: 30 mL

## 2021-03-09 MED ORDER — DEXTROSE 5 % IV SOLN: INTRAVENOUS | Status: DC

## 2021-03-09 MED ORDER — DIPHENHYDRAMINE HCL 50 MG/ML IJ SOLN: 25.0000 mg | INTRAMUSCULAR | Status: DC | PRN

## 2021-03-09 MED ORDER — MIDAZOLAM BOLUS VIA INFUSION
0.0000 mg | INTRAVENOUS | Status: DC | PRN
  Filled 2021-03-09: qty 2

## 2021-03-09 MED ORDER — TIROFIBAN HCL IN NACL 5-0.9 MG/100ML-% IV SOLN
INTRAVENOUS | Status: AC | PRN
  Administered 2021-03-09: 0.075 ug/kg/min via INTRAVENOUS

## 2021-03-09 MED ORDER — ACETAMINOPHEN 325 MG PO TABS: 650.0000 mg | ORAL_TABLET | ORAL | Status: DC | PRN

## 2021-03-09 MED ORDER — DOCUSATE SODIUM 50 MG/5ML PO LIQD: 100.0000 mg | Freq: Two times a day (BID) | ORAL | Status: DC

## 2021-03-09 MED ORDER — ONDANSETRON HCL 4 MG/2ML IJ SOLN: 4.0000 mg | Freq: Four times a day (QID) | INTRAMUSCULAR | Status: DC | PRN

## 2021-03-09 MED ORDER — VASOPRESSIN 20 UNITS/100 ML INFUSION FOR SHOCK: 0.0000 [IU]/min | INTRAVENOUS | Status: DC

## 2021-03-09 MED ORDER — FENTANYL CITRATE (PF) 100 MCG/2ML IJ SOLN: 25.0000 ug | Freq: Once | INTRAMUSCULAR | Status: DC

## 2021-03-09 MED ORDER — INSULIN ASPART 100 UNIT/ML ~~LOC~~ SOLN: 3.0000 [IU] | Freq: Three times a day (TID) | SUBCUTANEOUS | Status: DC

## 2021-03-09 MED ORDER — ACETAMINOPHEN 325 MG PO TABS: 650.0000 mg | ORAL_TABLET | Freq: Four times a day (QID) | ORAL | Status: DC | PRN

## 2021-03-10 MED FILL — Amiodarone HCl Inj 150 MG/3ML (50 MG/ML): INTRAVENOUS | Qty: 3 | Status: AC

## 2021-03-15 MED FILL — Medication: Qty: 1 | Status: AC

## 2021-03-17 NOTE — Progress Notes (Signed)
At 0456 patient was pronounced dead by RN Luna Glasgow and RN Tacy Dura Kuffour after no breath and heart sound were auscultated for greater than one minute. Family at bedside at time of passing. Attending Md and honor bridge notified. Patient placement card given to Son to call when he gets information about funeral home.

## 2021-03-17 NOTE — Consult Note (Signed)
Advanced Heart Failure/Shock Team Consult Note   Primary Physician: Pcp, No PCP-Cardiologist:  No primary care provider on file.  Reason for Consultation: Cardiogenic shock  HPI:    Marie Barrett is seen today for evaluation of cardiogenic shock at the request of Dr. Virgina Jock.   Patient initially presented to [facility] emergency room with abdominal pain earlier on 03/12/2021.  Work-up at that time was suggestive of colitis seen on CT abdomen and pelvis.  She also had a hemangioma seen in the spleen, along with extensive aortic atherosclerosis including high-grade renal artery stenosis.  Patient was discharged home.  However, she returned to the emergency room few hours later complaining of right-sided as well as left-sided chest pain.  At this time, she was found to have anterior STEMI.  Blood pressure was 60/30 mmHg. She was intubated in the ER and brought emergently to cath lab by Dr. Virgina Jock. In the lab she developed VT/VF and was shocked. On high does pressors epi 20 and NE 30   Cath showed occluded ostial LM with non-obstructive disease in RCA. Swan placed with cardiogenic shock physiology   Not candidate for Impella due to poor vasculature. IABP placed with mild spontaneous reperfusion of LM. LAD and LCX wired and had what appeared to be diffuse thrombosis with poor run-off. LM IVUSed with no high-grade stenosis. Patient persistently hypotensive. I gave her several rounds of bicarb and increased epi gtt to 30. Plan to start aggrastat gtt and take to ICU.   Review of Systems: unavailable due intubation  Home Medications Prior to Admission medications   Medication Sig Start Date End Date Taking? Authorizing Provider  ciprofloxacin (CIPRO) 500 MG tablet Take 1 tablet (500 mg total) by mouth every 12 (twelve) hours. 02/22/2021   Isla Pence, MD  metroNIDAZOLE (FLAGYL) 500 MG tablet Take 1 tablet (500 mg total) by mouth 2 (two) times daily. 03/04/2021   Isla Pence, MD     Past Medical History: Past Medical History:  Diagnosis Date  . Coronary artery disease   . Diabetes mellitus without complication ([place])   . Hypertension     Past Surgical History: History reviewed. No pertinent surgical history.  Family History: No family history on file. Unavailable due to intubation   Social History: Social History   Socioeconomic History  . Marital status: Single    Spouse name: Not on file  . Number of children: Not on file  . Years of education: Not on file  . Highest education level: Not on file  Occupational History  . Not on file  Tobacco Use  . Smoking status: Never Smoker  . Smokeless tobacco: Never Used  Substance and Sexual Activity  . Alcohol use: Never  . Drug use: Never  . Sexual activity: Not on file  Other Topics Concern  . Not on file  Social History Narrative  . Not on file   Social Determinants of Health   Financial Resource Strain: Not on file  Food Insecurity: Not on file  Transportation Needs: Not on file  Physical Activity: Not on file  Stress: Not on file  Social Connections: Not on file    Allergies:  No Known Allergies  Objective:    Vital Signs:   Temp:  [98.3 F (36.8 C)] 98.3 F (36.8 C) (03/23 0857) Pulse Rate:  [53-112] 112 (03/23 2245) Resp:  [13-32] 32 (03/23 2245) BP: (55-182)/(32-88) 69/32 (03/23 2230) SpO2:  [56 %-100 %] 63 % (03/23 2245) FiO2 (%):  [  100 %] 100 % (03/23 2331) Weight:  [75 kg] 75 kg (03/23 2206)    Weight change: Filed Weights   03/01/2021 2206  Weight: 75 kg    Intake/Output:  No intake or output data in the 24 hours ending Mar 29, 2021 0033    Physical Exam    General:  Critically ill intubated on cath lab table HEENT: normal + ETT Neck: supple. JVP up  . Carotids 2+ bilat; no bruits. No lymphadenopathy or thyromegaly appreciated. Cor: PMI nondisplaced. Irregular rate & rhythm.  Lungs: clear Abdomen: soft, nontender, nondistended. No hepatosplenomegaly. No bruits or  masses. Good bowel sounds. Extremities: no cyanosis, clubbing, rash, edema Neuro: intubated sedated   Telemetry   AF wide complex  EKG    Sinus brady 51 with massive anterior ST elevation Personally reviewed  Labs   Basic Metabolic Panel: Recent Labs  Lab 02/18/2021 0900 03/15/2021 2215 03/03/2021 2225  NA 135 132* 133*  K 4.8 4.4 4.2  CL 101 98 99  CO2 25 13*  --   GLUCOSE 145* 306* 301*  BUN 15 19 23   CREATININE 1.61* 2.32* 2.00*  CALCIUM 10.0 9.0  --     Liver Function Tests: Recent Labs  Lab 02/26/2021 0900 02/24/2021 2215  AST 92* 96*  ALT 41 52*  ALKPHOS 67 66  BILITOT 0.7 1.1  PROT 7.3 6.5  ALBUMIN 4.4 3.7   Recent Labs  Lab 02/22/2021 0900  LIPASE 32   No results for input(s): AMMONIA in the last 168 hours.  CBC: Recent Labs  Lab 03/07/2021 0900 03/13/2021 2215 03/11/2021 2225  WBC 5.9 11.7*  --   NEUTROABS  --  9.8*  --   HGB 13.9 12.9 14.3  HCT 41.4 39.6 42.0  MCV 87.9 91.7  --   PLT 207 214  --     Cardiac Enzymes: No results for input(s): CKTOTAL, CKMB, CKMBINDEX, TROPONINI in the last 168 hours.  BNP: BNP (last 3 results) No results for input(s): BNP in the last 8760 hours.  ProBNP (last 3 results) No results for input(s): PROBNP in the last 8760 hours.   CBG: No results for input(s): GLUCAP in the last 168 hours.  Coagulation Studies: Recent Labs    02/26/2021 2300  LABPROT 16.3*  INR 1.4*     Imaging   CT Abdomen Pelvis W Contrast  Result Date: 02/14/2021 CLINICAL DATA:  Generalized abdominal pain, history of gynecological cancer status post hysterectomy EXAM: CT ABDOMEN AND PELVIS WITH CONTRAST TECHNIQUE: Multidetector CT imaging of the abdomen and pelvis was performed using the standard protocol following bolus administration of intravenous contrast. CONTRAST:  1mL OMNIPAQUE IOHEXOL 300 MG/ML  SOLN COMPARISON:  None. FINDINGS: Lower chest: No acute pleural or parenchymal lung disease. Dense calcification of the mitral annulus.  Moderate coronary artery atherosclerosis. Hepatobiliary: No focal liver abnormality is seen. No gallstones, gallbladder wall thickening, or biliary dilatation. Pancreas: Unremarkable. No pancreatic ductal dilatation or surrounding inflammatory changes. Spleen: Indeterminate 1.3 cm hypodensity inferior aspect of the spleen, which may be sequela of previous infarct or small hemangioma. The remainder of the spleen is unremarkable, with no evidence of splenomegaly. Adrenals/Urinary Tract: The left kidney is markedly atrophic, with minimal cortical enhancement. There is high-grade stenosis of the left renal artery, with evidence of mural thrombus at the left renal artery ostia within the aortic lumen. Findings likely reflect a sequela of chronic longstanding renal artery stenosis. Minimal function of the left kidney, with no significant contrast excretion on delayed imaging. Areas  of scarring within the right renal cortex, primarily within the interpolar region, may also be sequela from chronic renal artery atherosclerosis. No evidence of obstructive uropathy within the right kidney. The adrenals are grossly normal. Bladder is decompressed, with nonspecific diffuse bladder wall thickening. No perivesicular fat stranding. Stomach/Bowel: There is segmental wall thickening of the rectosigmoid colon, with pericolonic fat stranding, consistent with inflammatory or infectious colitis. No bowel obstruction or ileus. Normal appendix right lower quadrant. Vascular/Lymphatic: There is extensive atherosclerosis throughout the aorta and its branches. As discussed above, there is high-grade stenosis of the left renal artery, with atheromatous plaque at the left renal artery ostia extending into the aortic lumen, best seen image 32/3. No pathologic adenopathy within the abdomen or pelvis. Reproductive: Status post hysterectomy. No adnexal masses. Other: No free fluid or free gas.  No abdominal wall hernia. Musculoskeletal: No acute or  destructive bony lesions. Reconstructed images demonstrate no additional findings. IMPRESSION: 1. Segmental wall thickening and inflammatory changes of the rectosigmoid colon, consistent with inflammatory or infectious colitis. 2. Abnormal appearance of the bilateral kidneys, left greater than right, likely sequela of chronic renal artery atherosclerosis. 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 3. Diffuse bladder wall thickening, nonspecific given decompressed state. 4. Indeterminate hypodensity inferior aspect of the spleen, which could reflect focal scarring or small hemangioma. 5. Extensive calcification of the mitral annulus, with diffuse coronary artery atherosclerosis. 6.  Aortic Atherosclerosis (ICD10-I70.0). Electronically Signed   By: Randa Ngo M.D.   On: 02/28/2021 15:57   DG Chest Port 1 View  Result Date: 03/05/2021 CLINICAL DATA:  Chest pain, post intubation EXAM: PORTABLE CHEST 1 VIEW COMPARISON:  None FINDINGS: Endotracheal tube is 2.7 cm above the carina. NG tube tip is in the stomach. Cardiomegaly, vascular congestion. Bilateral airspace opacities in the left perihilar region/lingula and right upper lobe concerning for pneumonia. No effusions. No acute bony abnormality. IMPRESSION: Support devices as above. Cardiomegaly, vascular congestion. Patchy bilateral airspace disease in the left perihilar region/lingula and right upper lobe concerning for pneumonia. Electronically Signed   By: Rolm Baptise M.D.   On: 03/02/2021 23:08      Medications:     Current Medications: . [MAR Hold] heparin  4,000 Units Intravenous Once     Infusions: . DOPamine Stopped (03/13/2021 2318)  . [MAR Hold] epinephrine 30 mcg/min (02/22/2021 2358)        Assessment/Plan   1. CAD with  anterior STEMI - with probable thrombotic occlusion of LM 2. VT/VF arrest 3. Cardiogenic shock 4. Acute hypoxic respiratory failure  5. DM2 6. AKI due to ATN  She has had a massive infarct with profound cardiogenic shock and cardiac arrest. Cath with probable thrombotic occlusion of LM moderate reperfusion with IABP and wire insertion. No clear PCI targets. Agree with aggrastat gtt.   Rhythm remains unstable. Hemodynamics marginal on max pressors. I doubt she will survive the evening. Unfortunately nothing left to offer at this point aside from continue pressors for hemodynamic support.   CRITICAL CARE Performed by: Glori Bickers  Total critical care time: 120 minutes  Critical care time was exclusive of separately billable procedures and treating other patients.  Critical care was necessary to treat or prevent imminent or life-threatening deterioration.  Critical care was time spent personally by me (independent of midlevel providers or  residents) on the following activities: development of treatment plan with patient and/or surrogate as well as nursing, discussions with consultants, evaluation of patient's response to treatment, examination of patient, obtaining history from patient or surrogate, ordering and performing treatments and interventions, ordering and review of laboratory studies, ordering and review of radiographic studies, pulse oximetry and re-evaluation of patient's condition.    Length of Stay: 0  Glori Bickers, MD  03-22-2021, 12:33 AM  Advanced Heart Failure Team Pager [phone] (M-F; 7a - 5p)  Please contact [facility] for night-coverage after hours (4p -7a ) and weekends on [email or web address]

## 2021-03-17 NOTE — Procedures (Signed)
Extubation Procedure Note  Patient Details:   Name: Marie Barrett DOB: January 30, 1941 MRN: [id number]   Airway Documentation:    Vent end date: 03/25/21 Vent end time: 0455   Evaluation  O2 sats: transiently fell during during procedure Complications: No apparent complications Patient did not tolerate procedure well. Bilateral Breath Sounds: Diminished   No   Terminal extubation per MD orders.  Audrionna Lampton R Lori Liew 03/25/21, 5:20 AM

## 2021-03-17 NOTE — Discharge Summary (Signed)
Physician Discharge Summary  Patient ID: Marie Barrett MRN: [id number] DOB/AGE: 78-Feb-1942 78 y.o.  Admit date: 03/15/2021 Discharge date: 03-15-2021  Primary Discharge Diagnosis: STEMI Cardiogenic shock   Secondary Discharge Diagnosis: Hypertension   Hospital Course:   Death summary:  80 year old Spanish-speaking female visiting from [place], known baseline questionable coronary artery disease, hypertension, type 2 diabetes mellitus, now with STEMI and cardiogenic shock.  Patient initially presented to [facility] emergency room with abdominal pain earlier on 03/01/2021.  Work-up at that time was suggestive of colitis seen on CT abdomen and pelvis.  There was also extensive aortic atherosclerosis including high-grade renal artery stenosis. Patient was discharged home on Flagyl and told to return if symptoms were to worsen.  Patient reached home and reportedly developed chest pain and had a syncopal episode.  She was brought back to emergency room.  EKG was performed, which showed profound anterior ST elevations.  Blood pressure was 60/30 mmHg.  Patient was in cardiogenic shock.  On my initial discussion with the patient, it was clear that her mental status was deteriorating.  Decision was made to intubate her for airway protection and stabilization.  I used interpreter to discuss with her son regarding her wishes.  He gave verbal consent to proceed with coronary angiography and possible support placement.  I explained to the patient that her mortality risk was very high given her presentation and comorbidities.  I also discussed with heart failure specialist Dr. Pierre Bali regarding options for mechanical support.  We felt that given her extensive atherosclerotic disease in her aorta and branches, she is not a candidate for Impella support.  Therefore, we decided to use intra-aortic balloon pump as minimal support, and perform coronary angiography, left and right heart  catheterization, and possible intervention.  Coronary angiogram showed distal left main thrombotic occlusion, with no flow beyond distal left main.  Left and right heart catheterization showed severe cardiogenic shock with cardiac index of 1.1 L/min/m while on norepinephrine and epinephrine drips.  Patient had total of 3 ventricular tachycardia events between emergency room and Cath Lab, followed by subsequent VT VF arrest while in the ICU.  I wired LAD and reestablish flow.  There was high-grade ostial circumflex lesion, which was not a culprit stenosis.  I also performed IVUS, but did not see any critical lesion.  There was moderate thrombotic lesion in distal left main.  There was poor flow in all coronaries.  Patient continued to deteriorate hemodynamically in spite of intra-aortic balloon pump placement.  Lactic acid came back at greater than 11.  At this point, Dr. Haroldine Laws and myself both felt that her overall prognosis was poor.  She was not a candidate for any escalation of mechanical support.  Therefore, be abandoned the procedure and move the patient to the ICU.  I personally had discussion with her son [name] using interpreter and explained the dire situation to the patient.  Patient continues to have recurrent VF VT arrest while in the ICU.  She was eventually made DNR and comfort care.  Patient unfortunately passed few hours later.  Patient was pronounced by the ICU team and I was notified.   Significant Diagnostic Studies:  Coronary angiography 03/06/2021: Delayed presentation Distal LM occlusion, likely thrombotic Flow established with IABP No critical lesion to stent in LM/LAD LCx lesion is non-culprit.  Severely dilated LV LVEDP 38 mmHg  Overall poor prognosis IABP to remain in place  Discussed with son [name]. He understands poor prognosis, but wants  her to remain full code    EKG 02/18/2021: Anterior STEMI:   Labs:   Lab Results  Component Value Date   WBC  11.7 (H) 03/05/2021   HGB 9.2 (L) March 12, 2021   HCT 27.0 (L) 03/12/2021   MCV 91.7 02/17/2021   PLT 214 03/02/2021    Recent Labs  Lab 02/28/2021 2215 02/26/2021 2225 02/20/2021 2324 03/12/2021 0140  NA 132* 133*   < > 132*  K 4.4 4.2   < > 4.6  CL 98 99  --   --   CO2 13*  --   --   --   BUN 19 23  --   --   CREATININE 2.32* 2.00*  --   --   CALCIUM 9.0  --   --   --   PROT 6.5  --   --   --   BILITOT 1.1  --   --   --   ALKPHOS 66  --   --   --   ALT 52*  --   --   --   AST 96*  --   --   --   GLUCOSE 306* 301*  --   --    < > = values in this interval not displayed.    Lipid Panel     Component Value Date/Time   CHOL 216 (H) 03/02/2021 2216   TRIG 98 03/11/2021 2216   HDL 67 02/26/2021 2216   CHOLHDL 3.2 03/01/2021 2216   VLDL 20 03/07/2021 2216   LDLCALC 129 (H) 02/16/2021 2216     HEMOGLOBIN A1C Lab Results  Component Value Date   HGBA1C 5.9 (H) 03/03/2021   MPG 122.63 03/14/2021    Cardiac Panel (last 3 results) Results for ALISE, CALAIS (MRN [id number]) as of 2021/03/12 16:42  Ref. Range 02/14/2021 22:15 03/12/21 03:12  Troponin I (High Sensitivity) Latest Ref Range: <18 ng/L 117 (HH) >27,000 (HH)       Nigel Mormon, MD Pager: [phone] Office: [phone]

## 2021-03-17 NOTE — Progress Notes (Signed)
Critical care attending attestation note:  Patient seen and examined and relevant ancillary tests reviewed.  I agree with the assessment and plan of care as outlined by [place] Crane, APP. This patient was not seen as a shared visit. The following reflects my independent critical care time.   Synopsis of assessment and plan:  # Cardiogenic shock  # STEMI s/p attempted revascularization and IABP placement # Recurrent VT/VF # VT arrest # Acute hypoxic, hypercapnic respiratory failure  - patient's course and poor candidacy for any further attempt at revascularization or alternative mechanical circulatory support discussed at bedside with her son. He was in agreement to update code status to DNR, transition to comfort measures once all available family able to visit Ms. Schild.   CRITICAL CARE Performed by: Maryjane Hurter   Total critical care time: 31 minutes  Critical care time was exclusive of separately billable procedures and treating other patients.  Critical care was necessary to treat or prevent imminent or life-threatening deterioration.  Critical care was time spent personally by me on the following activities: development of treatment plan with patient and/or surrogate as well as nursing, discussions with consultants, evaluation of patient's response to treatment, examination of patient, obtaining history from patient or surrogate, ordering and performing treatments and interventions, ordering and review of laboratory studies, ordering and review of radiographic studies, pulse oximetry, re-evaluation of patient's condition and participation in multidisciplinary rounds.  Letta Median, Pulmonary/Critical Care

## 2021-03-17 NOTE — Progress Notes (Addendum)
Pt family expressed to [place] and [facility] their wishes to extubate the the patient. Mickel Baas, [place] (CCM) and Respiratory notified.

## 2021-03-17 NOTE — Progress Notes (Signed)
Responding to a page, Chaplain first met pt's son in waiting area outside Cath Lab where his mother was undergoing a procedure.  Upon seeing the Chaplain son began to cry as if his heart was broken.  Chaplain discerned pt only spoke [place] and did not understand much, if any, [place]. Chaplain offered a quiet presence of  that communicated beyond the spoken word.   Chaplain procured Stratus communicator and began to speak with pt's son.  Several times throughout the evening Chaplain was called away, always returning to check on Marie Barrett and his mother.  Chaplain facilitated Stratus conversation between son and physicians who explained how dire his mother's condition was.  Chaplain continued to be contact with Marie Barrett throughout the time he had left to be with his mother.  [place]

## 2021-03-17 NOTE — Consult Note (Addendum)
NAME:  Nashayla Telleria, MRN:  [id number], DOB:  1941-04-23, LOS: 0 ADMISSION DATE:  03/01/2021, CONSULTATION DATE:  2021-04-06 REFERRING MD:  Bensimhon CHIEF COMPLAINT:  Hypotension, cardiogenic shock   History of Present Illness:  Ms. Shere is a 80 year old female with PMHx significant for HTN, CAD and T2DM who initially presented to [facility] ED early 02/25/2021 for abdominal pain; workup suggestive of colitis on CT, also revealed extensive aortic atherosclerosis including high-grade RAS. She was discharged home but returned to the ED a few hours later complaining of both R and L-sided chest pain. Anterior STEMI was noted and she became hypotensive (SBP 60s). She ws intubated in the ED and taken emergently to the cath lab during which heart rhythm deteriorated to VT/VF; patient was shocked x 3 and high-dose pressors (Epi, Levo) were initiated; patient did not receive CPR at this time. Additionally in the cath lab, Luiz Blare was placed (c/w cardiogenic shock). Due to poor vasculature, patient was deemed not a candidate for Impella and IABP was placed. Patient required several doses of bicarb and persistently required high-dose pressors. PCCM was consulted on arrival to [facility] for ventilator and hemodynamic management.  Patient arrived to [facility] post-cath lab profoundly hypotensive with SBPs 60s despite high-dose pressors (Epi 30, Levo 30); Levo was increased to 45 on arrival to [facility] without improvement in pressures. Shortly after arrival, patient's rhythm again deteriorated from a wide-complex irregularly irregular rhythm to VT/VF and CPR x 2 rounds was initiated with additional shock x 2. Patient briefly returned to her previous irregularly irregular rhythm, but then returned to VT/VF. At that time, discussed with Cardiology/Heart Failure regarding additional measures and recommendation was made for no further resuscitation.  Pertinent  Medical History   Past Medical History:  Diagnosis Date  . Coronary artery  disease   . Diabetes mellitus without complication ([place])   . Hypertension    Significant Hospital Events: Including procedures, antibiotic start and stop dates in addition to other pertinent events   . 3/23 - Presented to [facility] ED with abdominal pain, dx with colitis/extensive aortic atherosclerosis/RAS, discharged home; later returned to ED with bilateral chest pain found to have STEMI with hypotension to 60s; intubated and Epi/NE started; VT/VF in cath lab with shock x 3, came to [place] on Epi 30/NE 30, increased pressor requirement and return to VT/VF, CPR x 2 rounds and 2 shocks, subsequently made DNR  Interim History / Subjective:    Objective   Blood pressure (!) 81/25, pulse (!) 0, resp. rate (!) 32, height 5\' 3"  (1.6 m), weight 75 kg, SpO2 (!) 0 %. PAP: (32-69)/(24-44) 35/24 CVP:  [5 mmHg-23 mmHg] 9 mmHg  Vent Mode: PRVC FiO2 (%):  [100 %] 100 % Set Rate:  [22 bmp] 22 bmp Vt Set:  [420 mL] 420 mL PEEP:  [8 cmH20] 8 cmH20 Plateau Pressure:  [18 cmH20] 18 cmH20   Intake/Output Summary (Last 24 hours) at 04-06-21 0401 Last data filed at April 06, 2021 0200 Gross per 24 hour  Intake 571.75 ml  Output -  Net 571.75 ml   Filed Weights   02/19/2021 2206  Weight: 75 kg   Physical Examination: General: Chronically ill-appearing female, unstable. HEENT: [place]/AT, anicteric sclera, PERRL, moist mucous membranes. ETT in place Neuro: Stuporous. Does not respond to verbal, tactile or noxious stimuli. Not following commands.  CV: Irregularly irregular rhythm, no m/g/r. PULM: Breathing even and unlabored on vent (PEEP 5 FiO2 100%). Lung fields coarse bilaterally, c/w mechanical ventilation. GI: Soft, nontender,  mildly distended. Normoactive bowel sounds. Extremities: Minimal BLE edema noted. Skin: Warm/dry, no rashes noted.  Labs/imaging that I have personally reviewed   WBC 11.7, H&H 12.9/39.6, Plt 214 CMP Na 132, K 4.4, Bicarb 13 (supplemented), Gluc 306, Cr 2.3 Lactic acid > 11 Trop >  27,000 ABG 7.20/29.6/518/11.9  Resolved Hospital Problem list     Assessment & Plan:  Severe cardiogenic shock in the setting of STEMI, post multiple VT arrests Acute hypoxic respiratory failure in the setting of cardiogenic shock Anterior STEMI PCCM consulted for management of hemodynamics and ventilator post-cath lab/code. Unfortunately on arrival to the ICU, Ms. Bresnan remained profoundly hypotensive despite pressors and returned to VT/VF prompting CPR x 2 rounds and additional shock x 2. Patient briefly returned to her previous irregularly irregular rhythm, but then returned to VT/VF. At that time, discussed with Cardiology regarding additional measures and recommendation was made for no further resuscitation; PCCM updated son at bedside with the help of the translator service on patient's poor prognosis and made recommendations for no further ACLS measures. Patient was subsequently made DNR with plan to prioritize patient's comfort and family was brought in to visit with patient until ready for compassionate extubation. - DNR status - Transition to comfort care - Family at bedside  Best practice (evaluated daily)  Diet:  NPO Pain/Anxiety/Delirium protocol (if indicated): Yes (RASS goal -1,-2) VAP protocol (if indicated): Yes DVT prophylaxis: SCD GI prophylaxis: PPI Glucose control:  SSI Yes Central venous access:  Yes, and it is still needed Arterial line:  Yes, and it is still needed Foley:  N/A Mobility:  bed rest  PT consulted: N/A Last date of multidisciplinary goals of care discussion [3/24] Code Status:  DNR Disposition: ICU  Labs   CBC: Recent Labs  Lab 03/12/2021 0900 03/01/2021 2215 03/10/2021 2225 03/10/2021 2324 03/05/2021 2336 03/22/21 0003 03/22/21 0140  WBC 5.9 11.7*  --   --   --   --   --   NEUTROABS  --  9.8*  --   --   --   --   --   HGB 13.9 12.9 14.3 12.2 10.9*  11.6* 11.2* 9.2*  HCT 41.4 39.6 42.0 36.0 32.0*  34.0* 33.0* 27.0*  MCV 87.9 91.7  --   --    --   --   --   PLT 207 214  --   --   --   --   --     Basic Metabolic Panel: Recent Labs  Lab 03/15/2021 0900 03/16/2021 2215 03/07/2021 2225 02/27/2021 2324 03/05/2021 2336 03-22-21 0003 03-22-2021 0140  NA 135 132* 133* 141 141  148* 138 132*  K 4.8 4.4 4.2 3.7 4.2  4.3 3.8 4.6  CL 101 98 99  --   --   --   --   CO2 25 13*  --   --   --   --   --   GLUCOSE 145* 306* 301*  --   --   --   --   BUN 15 19 23   --   --   --   --   CREATININE 1.61* 2.32* 2.00*  --   --   --   --   CALCIUM 10.0 9.0  --   --   --   --   --    GFR: Estimated Creatinine Clearance: 22.8 mL/min (A) (by C-G formula based on SCr of 2 mg/dL (H)). Recent Labs  Lab 02/20/2021 0900 02/23/2021 2215 03/04/2021  2355  WBC 5.9 11.7*  --   LATICACIDVEN  --   --  >11.0*    Liver Function Tests: Recent Labs  Lab 03/15/2021 0900 02/19/2021 2215  AST 92* 96*  ALT 41 52*  ALKPHOS 67 66  BILITOT 0.7 1.1  PROT 7.3 6.5  ALBUMIN 4.4 3.7   Recent Labs  Lab 03/01/2021 0900  LIPASE 32   No results for input(s): AMMONIA in the last 168 hours.  ABG    Component Value Date/Time   PHART 7.205 (L) 03/26/21 0140   PCO2ART 29.6 (L) 2021-03-26 0140   PO2ART 518 (H) 2021/03/26 0140   HCO3 11.9 (L) 26-Mar-2021 0140   TCO2 13 (L) 03/26/21 0140   ACIDBASEDEF 15.0 (H) 03-26-2021 0140   O2SAT 100.0 03-26-2021 0140     Coagulation Profile: Recent Labs  Lab 03/05/2021 2300  INR 1.4*    Cardiac Enzymes: No results for input(s): CKTOTAL, CKMB, CKMBINDEX, TROPONINI in the last 168 hours.  HbA1C: Hgb A1c MFr Bld  Date/Time Value Ref Range Status  03/03/2021 10:16 PM 5.9 (H) 4.8 - 5.6 % Final    Comment:    (NOTE) Pre diabetes:          5.7%-6.4%  Diabetes:              >6.4%  Glycemic control for   <7.0% adults with diabetes     CBG: No results for input(s): GLUCAP in the last 168 hours.  Review of Systems:   Patient is encephalopathic and/or intubated. Therefore history has been obtained from chart review.    Past Medical History:  She,  has a past medical history of Coronary artery disease, Diabetes mellitus without complication ([place]), and Hypertension.   Surgical History:  History reviewed. No pertinent surgical history.   Social History:   reports that she has never smoked. She has never used smokeless tobacco. She reports that she does not drink alcohol and does not use drugs.   Family History:  Her family history is not on file.   Allergies No Known Allergies   Home Medications  Prior to Admission medications   Medication Sig Start Date End Date Taking? Authorizing Provider  ciprofloxacin (CIPRO) 500 MG tablet Take 1 tablet (500 mg total) by mouth every 12 (twelve) hours. 02/19/2021   Isla Pence, MD  metroNIDAZOLE (FLAGYL) 500 MG tablet Take 1 tablet (500 mg total) by mouth 2 (two) times daily. 02/17/2021   Isla Pence, MD    Critical care time: 80 minutes   Lestine Mount, [place] [facility] 2021-03-26 4:02 AM  Please see [email or web address] for pager details.

## 2021-03-17 NOTE — Plan of Care (Signed)
Spoke with pt's son at bedside. Discussed that in event of cardiac arrest, ACLS would not change outcome meaningfully. He was in agreement and code status updated to DNR. Once he's been able to spend time with her he'll let us know when he's ready tonight to prioritize her comfort and for [place] to prepare for compassionate extubation.

## 2021-03-17 NOTE — Progress Notes (Signed)
eLink Physician-Brief Progress Note Patient Name: Marie Barrett DOB: 1941/11/18 MRN: [id number]   Date of Service  March 10, 2021  HPI/Events of Note  Patient admitted from the cath lab in cardiogenic shock s/p anterior STEMI with delayed intervention prior to hospitalization, patient is on a balloon pump and has max dose Epinephrine and Norepinephrine infusions with marginal blood pressures. Patient is intubated and mechanically ventilated.  eICU Interventions  New Patient Evaluation completed. Ventilator and PAD protocol orders entered, Vasopressin added for persistent shock despite 2 pressors that are maxed out, overall prognosis is grim. PCCM ground crew now present in the room.        [name] [name] 03-10-2021, 1:33 AM

## 2021-03-17 NOTE — Progress Notes (Addendum)
Pt arrived to the unit approximately at 0100. Arrival rates of already hung medications were 63mcg/min of Levophed, and 60mcg/min of Epi. Pt presented in a Wide Complex Afib.   At 0130 pt began to drop her blood pressure. With CCM at bedside, verbal orders to give 1amp Calcium Chloride. Medication was administered at 0135.   Pt went into VT at 0139 and was shacked at 120j. Pt returned to a Wide Complex Afib arrhythmia with a pulse. At [place], pt went back in VT and was shocked again at 200j, rhythm showed Wide Complex Afib with NO Pulse, at this time, CPR was started and a Code Blue was initiated. 0142 1amp of Epi given. Pulse check at 0144, that showed Wide Complex Arrhythmia with a Pulse.    I called Dr. Arman Filter, who then gave verbal orders to stop CPR, revert the patient to DNR, and only treat the arrhythmia. At this time, 0150, patient had returned to VT with a pulse.  Per CCM 150mg  Amio bolus verbally ordered and given at 0153. All medications are currently infusing.   Pt family member was brought to the bedside and CCM explained to the pt family member through the translator the poor prognosis of the patient, and the change of Code status to DNR. At this time, the plan is to allow the family to visit with plans to begin comfort care measures once the family member is ready. CCM is currently at bedside.

## 2021-03-17 DEATH — deceased

## 2022-10-01 IMAGING — CT CT ABD-PELV W/ CM
2 of 5 series · 15 of 46 positions shown, 17 images · IV contrast (omnipaque)
Comparison: None.

CLINICAL DATA: Generalized abdominal pain, history of gynecological
cancer status post hysterectomy

EXAM:
CT ABDOMEN AND PELVIS WITH CONTRAST
TECHNIQUE: Multidetector CT imaging of the abdomen and pelvis was performed
using the standard protocol following bolus administration of
intravenous contrast.
CONTRAST:  75mL OMNIPAQUE IOHEXOL 300 MG/ML  SOLN

[Series 3: abd/ pelvis 5.0 i30f 2 · axial · 0.91mm/px · z∈[+850,+1260]mm · 12 of 92 slices shown, 14 images]
[im 5/92  soft-tissue]
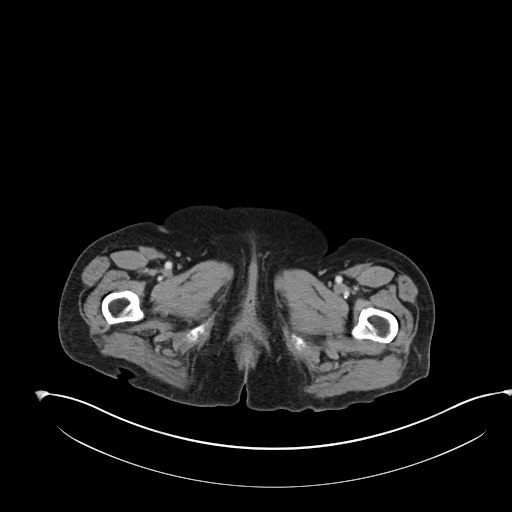
[im 5/92  bone]
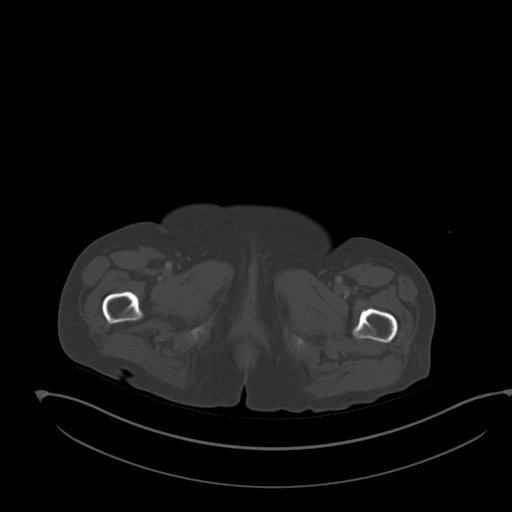
[im 15/92  soft-tissue]
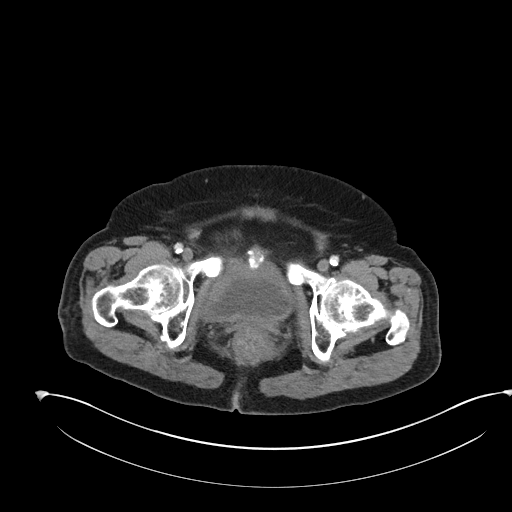
[im 20/92  soft-tissue]
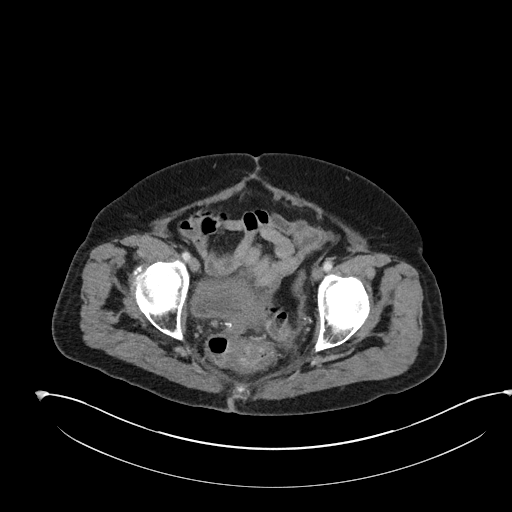
[im 29/92  soft-tissue]
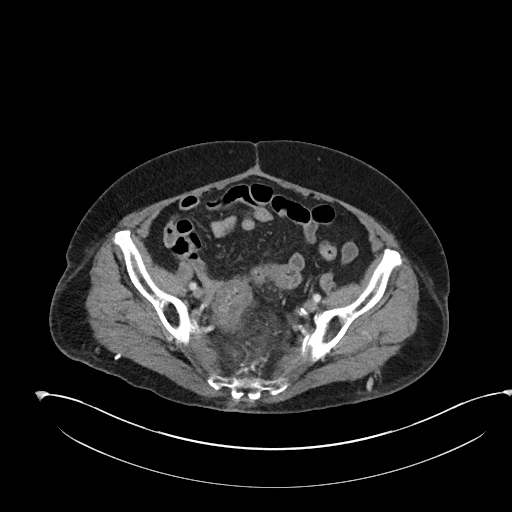
[im 34/92  soft-tissue]
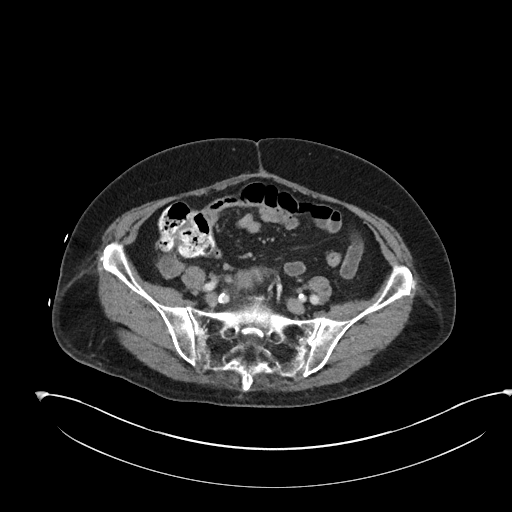
[im 44/92  soft-tissue]
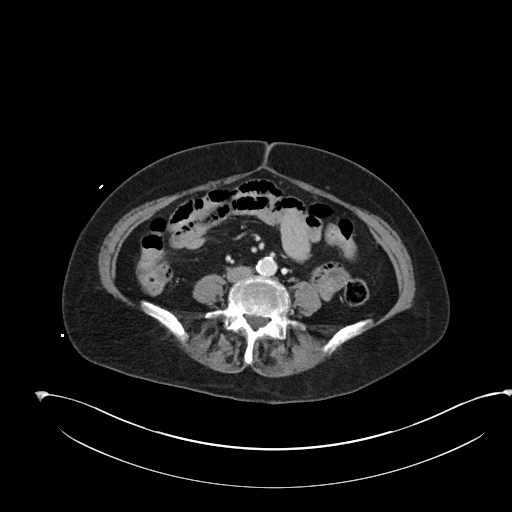
[im 48/92  soft-tissue]
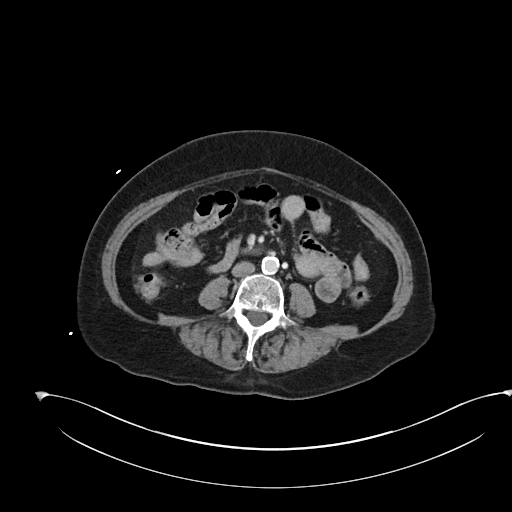
[im 58/92  soft-tissue]
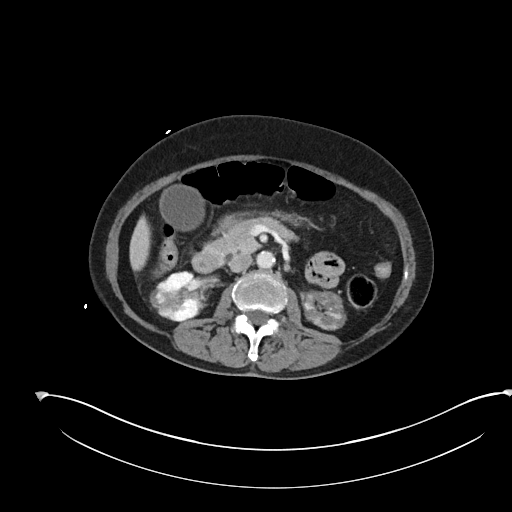
[im 63/92  soft-tissue]
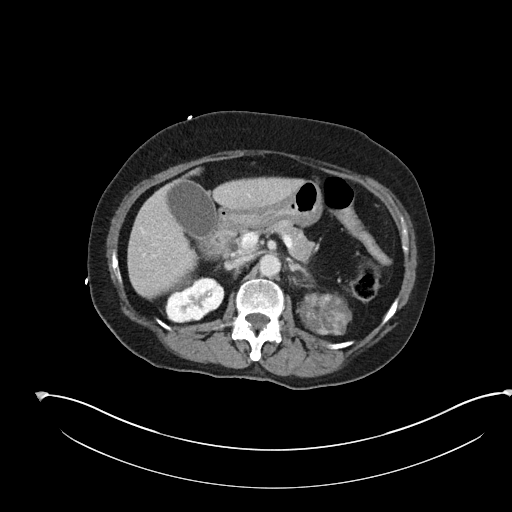
[im 63/92  bone]
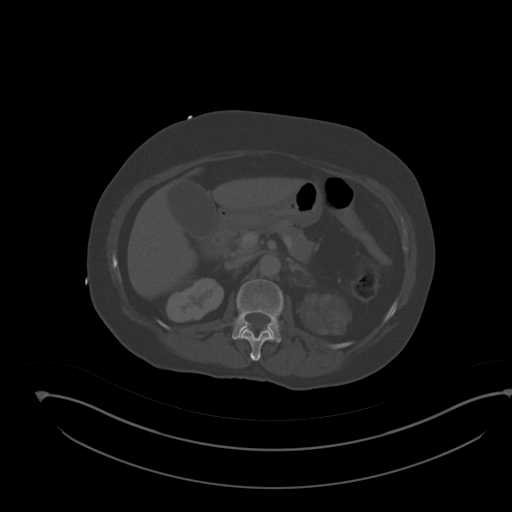
[im 72/92  soft-tissue]
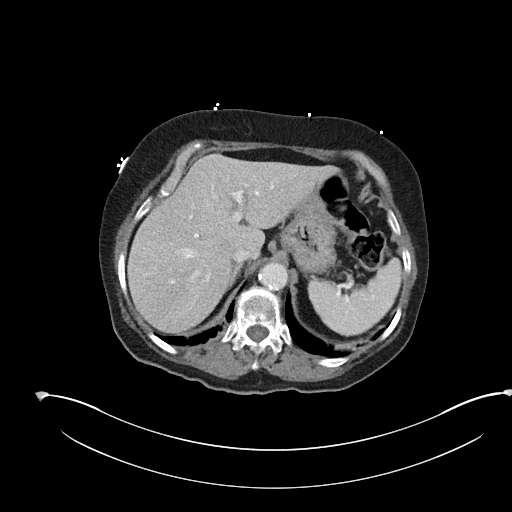
[im 77/92  soft-tissue]
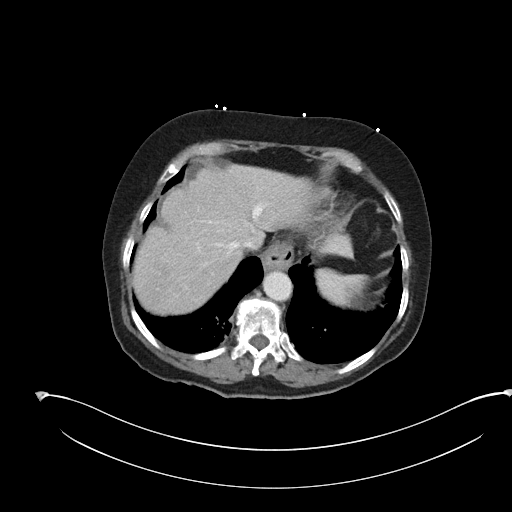
[im 87/92  soft-tissue]
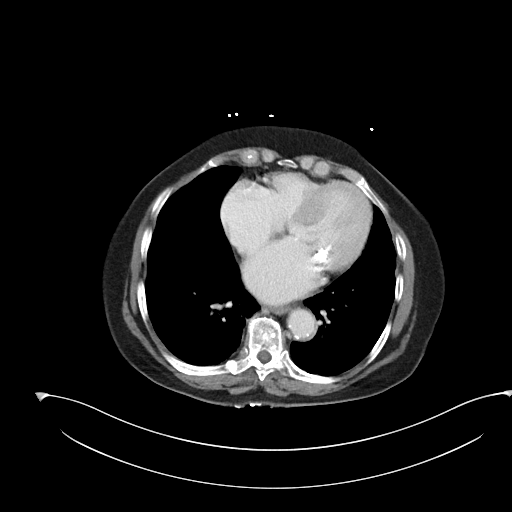

[Series 6: coronal soft tissue · coronal · 0.85mm/px · 3 of 101 slices shown]
[im 34/101  soft-tissue]
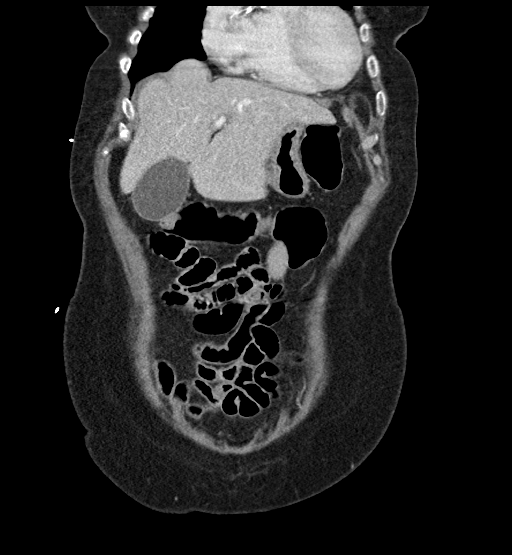
[im 45/101  soft-tissue]
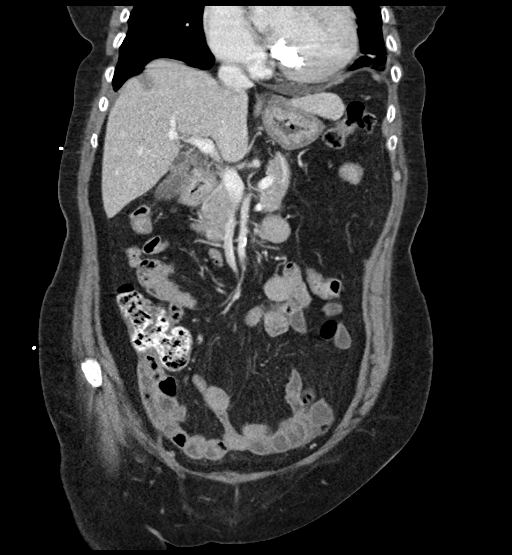
[im 56/101  soft-tissue]
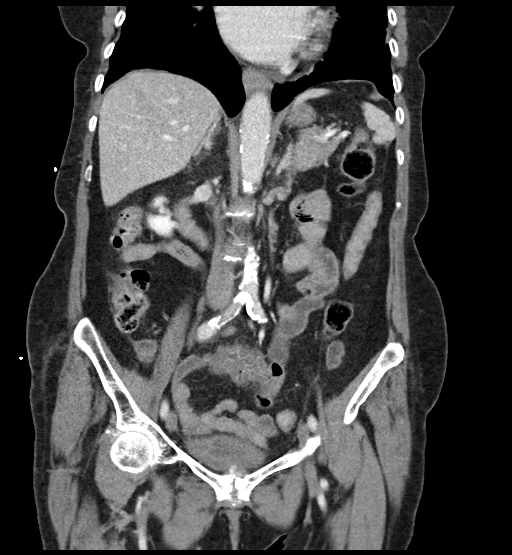

[15 of 46 positions shown; findings below may reference images not displayed]

FINDINGS: Lower chest: No acute pleural or parenchymal lung disease. Dense
calcification of the mitral annulus. Moderate coronary artery
atherosclerosis.

Hepatobiliary: No focal liver abnormality is seen. No gallstones,
gallbladder wall thickening, or biliary dilatation.

Pancreas: Unremarkable. No pancreatic ductal dilatation or
surrounding inflammatory changes.

Spleen: Indeterminate 1.3 cm hypodensity inferior aspect of the
spleen, which may be sequela of previous infarct or small
hemangioma. The remainder of the spleen is unremarkable, with no
evidence of splenomegaly.

Adrenals/Urinary Tract: The left kidney is markedly atrophic, with
minimal cortical enhancement. There is high-grade stenosis of the
left renal artery, with evidence of mural thrombus at the left renal
artery ostia within the aortic lumen. Findings likely reflect a
sequela of chronic longstanding renal artery stenosis. Minimal
function of the left kidney, with no significant contrast excretion
on delayed imaging.

Areas of scarring within the right renal cortex, primarily within
the interpolar region, may also be sequela from chronic renal artery
atherosclerosis. No evidence of obstructive uropathy within the
right kidney.

The adrenals are grossly normal. Bladder is decompressed, with
nonspecific diffuse bladder wall thickening. No perivesicular fat
stranding.

Stomach/Bowel: There is segmental wall thickening of the
rectosigmoid colon, with pericolonic fat stranding, consistent with
inflammatory or infectious colitis. No bowel obstruction or ileus.
Normal appendix right lower quadrant.

Vascular/Lymphatic: There is extensive atherosclerosis throughout
the aorta and its branches. As discussed above, there is high-grade
stenosis of the left renal artery, with atheromatous plaque at the
left renal artery ostia extending into the aortic lumen, best seen
image 32/3.

No pathologic adenopathy within the abdomen or pelvis.

Reproductive: Status post hysterectomy. No adnexal masses.

Other: No free fluid or free gas.  No abdominal wall hernia.

Musculoskeletal: No acute or destructive bony lesions. Reconstructed
images demonstrate no additional findings.
IMPRESSION: 1. Segmental wall thickening and inflammatory changes of the
rectosigmoid colon, consistent with inflammatory or infectious
colitis.
2. Abnormal appearance of the bilateral kidneys, left greater than
right, likely sequela of chronic renal artery atherosclerosis.
High-grade stenosis of the left renal artery with eccentric
atheromatous plaque within the aortic lumen at the left renal artery
ostia as above, with minimal left renal cortical enhancement and no
significant excretion of contrast on delayed imaging.
3. Diffuse bladder wall thickening, nonspecific given decompressed
state.
4. Indeterminate hypodensity inferior aspect of the spleen, which
could reflect focal scarring or small hemangioma.
5. Extensive calcification of the mitral annulus, with diffuse
coronary artery atherosclerosis.
6.  Aortic Atherosclerosis ([id number]-[id number].L).
